# Patient Record
Sex: Male | Born: 1960 | Hispanic: No | Marital: Single | Smoking: Current every day smoker
Health system: Southern US, Community
[De-identification: ages and names within clinical notes are randomized; demographics above are authoritative.]

## PROBLEM LIST (undated history)

## (undated) DIAGNOSIS — A048 Other specified bacterial intestinal infections: Secondary | ICD-10-CM

## (undated) DIAGNOSIS — K219 Gastro-esophageal reflux disease without esophagitis: Secondary | ICD-10-CM

## (undated) HISTORY — DX: Other specified bacterial intestinal infections: A04.8

---

## 2021-11-25 ENCOUNTER — Inpatient Hospital Stay (HOSPITAL_COMMUNITY)
Admission: EM | Admit: 2021-11-25 | Discharge: 2021-12-01 | DRG: 380 | Disposition: A | Discharge status: Home / Self Care | Payer: Commercial Managed Care - PPO | Attending: Internal Medicine | Admitting: Internal Medicine

## 2021-11-25 ENCOUNTER — Encounter (HOSPITAL_COMMUNITY)
Admission: EM | Disposition: A | Discharge status: Home / Self Care | Payer: Self-pay | Source: Home / Self Care | Attending: Internal Medicine

## 2021-11-25 ENCOUNTER — Emergency Department (HOSPITAL_COMMUNITY): Payer: Commercial Managed Care - PPO

## 2021-11-25 ENCOUNTER — Other Ambulatory Visit: Payer: Self-pay

## 2021-11-25 ENCOUNTER — Inpatient Hospital Stay (HOSPITAL_COMMUNITY): Payer: Commercial Managed Care - PPO | Admitting: Certified Registered Nurse Anesthetist

## 2021-11-25 ENCOUNTER — Encounter (HOSPITAL_COMMUNITY): Payer: Self-pay | Admitting: *Deleted

## 2021-11-25 DIAGNOSIS — K2101 Gastro-esophageal reflux disease with esophagitis, with bleeding: Secondary | ICD-10-CM

## 2021-11-25 DIAGNOSIS — K254 Chronic or unspecified gastric ulcer with hemorrhage: Secondary | ICD-10-CM

## 2021-11-25 DIAGNOSIS — E860 Dehydration: Secondary | ICD-10-CM | POA: Diagnosis present

## 2021-11-25 DIAGNOSIS — K311 Adult hypertrophic pyloric stenosis: Secondary | ICD-10-CM | POA: Diagnosis present

## 2021-11-25 DIAGNOSIS — Z20822 Contact with and (suspected) exposure to covid-19: Secondary | ICD-10-CM | POA: Diagnosis present

## 2021-11-25 DIAGNOSIS — Z681 Body mass index (BMI) 19 or less, adult: Secondary | ICD-10-CM | POA: Diagnosis not present

## 2021-11-25 DIAGNOSIS — R748 Abnormal levels of other serum enzymes: Secondary | ICD-10-CM | POA: Diagnosis present

## 2021-11-25 DIAGNOSIS — B9681 Helicobacter pylori [H. pylori] as the cause of diseases classified elsewhere: Secondary | ICD-10-CM | POA: Diagnosis present

## 2021-11-25 DIAGNOSIS — R7303 Prediabetes: Secondary | ICD-10-CM | POA: Diagnosis present

## 2021-11-25 DIAGNOSIS — D72829 Elevated white blood cell count, unspecified: Secondary | ICD-10-CM

## 2021-11-25 DIAGNOSIS — E872 Acidosis, unspecified: Secondary | ICD-10-CM | POA: Diagnosis present

## 2021-11-25 DIAGNOSIS — N179 Acute kidney failure, unspecified: Secondary | ICD-10-CM | POA: Diagnosis present

## 2021-11-25 DIAGNOSIS — K2970 Gastritis, unspecified, without bleeding: Secondary | ICD-10-CM

## 2021-11-25 DIAGNOSIS — R64 Cachexia: Secondary | ICD-10-CM | POA: Diagnosis present

## 2021-11-25 DIAGNOSIS — N400 Enlarged prostate without lower urinary tract symptoms: Secondary | ICD-10-CM | POA: Diagnosis present

## 2021-11-25 DIAGNOSIS — I959 Hypotension, unspecified: Secondary | ICD-10-CM

## 2021-11-25 DIAGNOSIS — E876 Hypokalemia: Secondary | ICD-10-CM | POA: Diagnosis present

## 2021-11-25 DIAGNOSIS — K250 Acute gastric ulcer with hemorrhage: Secondary | ICD-10-CM | POA: Diagnosis not present

## 2021-11-25 DIAGNOSIS — Z79899 Other long term (current) drug therapy: Secondary | ICD-10-CM | POA: Diagnosis not present

## 2021-11-25 DIAGNOSIS — D62 Acute posthemorrhagic anemia: Secondary | ICD-10-CM

## 2021-11-25 DIAGNOSIS — R739 Hyperglycemia, unspecified: Secondary | ICD-10-CM | POA: Diagnosis present

## 2021-11-25 DIAGNOSIS — K2100 Gastro-esophageal reflux disease with esophagitis, without bleeding: Secondary | ICD-10-CM | POA: Diagnosis present

## 2021-11-25 DIAGNOSIS — K259 Gastric ulcer, unspecified as acute or chronic, without hemorrhage or perforation: Secondary | ICD-10-CM | POA: Diagnosis not present

## 2021-11-25 DIAGNOSIS — R6510 Systemic inflammatory response syndrome (SIRS) of non-infectious origin without acute organ dysfunction: Secondary | ICD-10-CM

## 2021-11-25 DIAGNOSIS — N289 Disorder of kidney and ureter, unspecified: Secondary | ICD-10-CM

## 2021-11-25 DIAGNOSIS — R109 Unspecified abdominal pain: Secondary | ICD-10-CM

## 2021-11-25 DIAGNOSIS — F17210 Nicotine dependence, cigarettes, uncomplicated: Secondary | ICD-10-CM | POA: Diagnosis present

## 2021-11-25 HISTORY — DX: Gastro-esophageal reflux disease without esophagitis: K21.9

## 2021-11-25 HISTORY — PX: BIOPSY: SHX5522

## 2021-11-25 HISTORY — PX: ESOPHAGOGASTRODUODENOSCOPY (EGD) WITH PROPOFOL: SHX5813

## 2021-11-25 LAB — CBC WITH DIFFERENTIAL/PLATELET
Abs Immature Granulocytes: 0.18 10*3/uL — ABNORMAL HIGH (ref 0.00–0.07)
Basophils Absolute: 0 10*3/uL (ref 0.0–0.1)
Basophils Relative: 0 %
Eosinophils Absolute: 0 10*3/uL (ref 0.0–0.5)
Eosinophils Relative: 0 %
HCT: 25.7 % — ABNORMAL LOW (ref 39.0–52.0)
Hemoglobin: 7.7 g/dL — ABNORMAL LOW (ref 13.0–17.0)
Immature Granulocytes: 1 %
Lymphocytes Relative: 3 %
Lymphs Abs: 0.6 10*3/uL — ABNORMAL LOW (ref 0.7–4.0)
MCH: 27.3 pg (ref 26.0–34.0)
MCHC: 30 g/dL (ref 30.0–36.0)
MCV: 91.1 fL (ref 80.0–100.0)
Monocytes Absolute: 1.5 10*3/uL — ABNORMAL HIGH (ref 0.1–1.0)
Monocytes Relative: 7 %
Neutro Abs: 19.7 10*3/uL — ABNORMAL HIGH (ref 1.7–7.7)
Neutrophils Relative %: 89 %
Platelets: 335 10*3/uL (ref 150–400)
RBC: 2.82 MIL/uL — ABNORMAL LOW (ref 4.22–5.81)
RDW: 17.5 % — ABNORMAL HIGH (ref 11.5–15.5)
WBC: 21.9 10*3/uL — ABNORMAL HIGH (ref 4.0–10.5)
nRBC: 0 % (ref 0.0–0.2)

## 2021-11-25 LAB — COMPREHENSIVE METABOLIC PANEL
ALT: 52 U/L — ABNORMAL HIGH (ref 0–44)
AST: 75 U/L — ABNORMAL HIGH (ref 15–41)
Albumin: 2.4 g/dL — ABNORMAL LOW (ref 3.5–5.0)
Alkaline Phosphatase: 63 U/L (ref 38–126)
Anion gap: 16 — ABNORMAL HIGH (ref 5–15)
BUN: 24 mg/dL — ABNORMAL HIGH (ref 8–23)
CO2: 18 mmol/L — ABNORMAL LOW (ref 22–32)
Calcium: 7.2 mg/dL — ABNORMAL LOW (ref 8.9–10.3)
Chloride: 106 mmol/L (ref 98–111)
Creatinine, Ser: 1.95 mg/dL — ABNORMAL HIGH (ref 0.61–1.24)
GFR, Estimated: 38 mL/min — ABNORMAL LOW (ref >60)
Glucose, Bld: 155 mg/dL — ABNORMAL HIGH (ref 70–99)
Potassium: 3.9 mmol/L (ref 3.5–5.1)
Sodium: 140 mmol/L (ref 135–145)
Total Bilirubin: 0.7 mg/dL (ref 0.3–1.2)
Total Protein: 5.4 g/dL — ABNORMAL LOW (ref 6.5–8.1)

## 2021-11-25 LAB — URINALYSIS, ROUTINE W REFLEX MICROSCOPIC
Bilirubin Urine: NEGATIVE
Glucose, UA: 50 mg/dL — AB
Ketones, ur: NEGATIVE mg/dL
Leukocytes,Ua: NEGATIVE
Nitrite: NEGATIVE
Protein, ur: 100 mg/dL — AB
Specific Gravity, Urine: 1.011 (ref 1.005–1.030)
pH: 7 (ref 5.0–8.0)

## 2021-11-25 LAB — I-STAT CHEM 8, ED
BUN: 27 mg/dL — ABNORMAL HIGH (ref 8–23)
Calcium, Ion: 0.85 mmol/L — CL (ref 1.15–1.40)
Chloride: 104 mmol/L (ref 98–111)
Creatinine, Ser: 1.9 mg/dL — ABNORMAL HIGH (ref 0.61–1.24)
Glucose, Bld: 149 mg/dL — ABNORMAL HIGH (ref 70–99)
HCT: 25 % — ABNORMAL LOW (ref 39.0–52.0)
Hemoglobin: 8.5 g/dL — ABNORMAL LOW (ref 13.0–17.0)
Potassium: 4.1 mmol/L (ref 3.5–5.1)
Sodium: 138 mmol/L (ref 135–145)
TCO2: 21 mmol/L — ABNORMAL LOW (ref 22–32)

## 2021-11-25 LAB — RESP PANEL BY RT-PCR (FLU A&B, COVID) ARPGX2
Influenza A by PCR: NEGATIVE
Influenza B by PCR: NEGATIVE
SARS Coronavirus 2 by RT PCR: NEGATIVE

## 2021-11-25 LAB — LACTIC ACID, PLASMA
Lactic Acid, Venous: 8.5 mmol/L (ref 0.5–1.9)
Lactic Acid, Venous: 5.3 mmol/L (ref 0.5–1.9)
Lactic Acid, Venous: 2.6 mmol/L (ref 0.5–1.9)
Lactic Acid, Venous: 2.8 mmol/L (ref 0.5–1.9)

## 2021-11-25 LAB — LIPASE, BLOOD: Lipase: 30 U/L (ref 11–51)

## 2021-11-25 LAB — CBG MONITORING, ED: Glucose-Capillary: 137 mg/dL — ABNORMAL HIGH (ref 70–99)

## 2021-11-25 LAB — POC OCCULT BLOOD, ED: Fecal Occult Bld: NEGATIVE

## 2021-11-25 LAB — ABO/RH: ABO/RH(D): O POS

## 2021-11-25 LAB — HIV ANTIBODY (ROUTINE TESTING W REFLEX): HIV Screen 4th Generation wRfx: NONREACTIVE

## 2021-11-25 LAB — PROTIME-INR
INR: 1.4 — ABNORMAL HIGH (ref 0.8–1.2)
Prothrombin Time: 16.7 seconds — ABNORMAL HIGH (ref 11.4–15.2)

## 2021-11-25 SURGERY — ESOPHAGOGASTRODUODENOSCOPY (EGD) WITH PROPOFOL
Anesthesia: General

## 2021-11-25 MED ORDER — PHENYLEPHRINE 40 MCG/ML (10ML) SYRINGE FOR IV PUSH (FOR BLOOD PRESSURE SUPPORT)
PREFILLED_SYRINGE | INTRAVENOUS | Status: DC | PRN
  Administered 2021-11-25 (×4): 80 ug via INTRAVENOUS
  Administered 2021-11-25: 160 ug via INTRAVENOUS

## 2021-11-25 MED ORDER — ACETAMINOPHEN 325 MG PO TABS: 650.0000 mg | ORAL_TABLET | Freq: Four times a day (QID) | ORAL | Status: DC | PRN

## 2021-11-25 MED ORDER — LACTATED RINGERS IV SOLN
INTRAVENOUS | Status: DC
Start: 2021-11-25 — End: 2021-11-29

## 2021-11-25 MED ORDER — CALCIUM GLUCONATE-NACL 1-0.675 GM/50ML-% IV SOLN
1.0000 g | Freq: Once | INTRAVENOUS | Status: AC
  Administered 2021-11-25: 1000 mg via INTRAVENOUS
  Filled 2021-11-25: qty 50

## 2021-11-25 MED ORDER — PROPOFOL 10 MG/ML IV BOLUS
INTRAVENOUS | Status: DC | PRN
Start: 2021-11-25 — End: 2021-11-25
  Administered 2021-11-25: 20 mg via INTRAVENOUS
  Administered 2021-11-25: 130 mg via INTRAVENOUS

## 2021-11-25 MED ORDER — ONDANSETRON HCL 4 MG/2ML IJ SOLN
INTRAMUSCULAR | Status: DC | PRN
  Administered 2021-11-25: 4 mg via INTRAVENOUS

## 2021-11-25 MED ORDER — LIDOCAINE 2% (20 MG/ML) 5 ML SYRINGE
INTRAMUSCULAR | Status: DC | PRN
  Administered 2021-11-25: 80 mg via INTRAVENOUS

## 2021-11-25 MED ORDER — OXYCODONE HCL 5 MG PO TABS: 5.0000 mg | ORAL_TABLET | ORAL | Status: DC | PRN

## 2021-11-25 MED ORDER — HYDRALAZINE HCL 20 MG/ML IJ SOLN: 5.0000 mg | INTRAMUSCULAR | Status: DC | PRN

## 2021-11-25 MED ORDER — SODIUM CHLORIDE 0.9 % IV SOLN
2.0000 g | INTRAVENOUS | Status: DC
  Administered 2021-11-25: 2 g via INTRAVENOUS
  Filled 2021-11-25: qty 2

## 2021-11-25 MED ORDER — ONDANSETRON HCL 4 MG/2ML IJ SOLN: 4.0000 mg | Freq: Four times a day (QID) | INTRAMUSCULAR | Status: DC | PRN

## 2021-11-25 MED ORDER — VANCOMYCIN HCL IN DEXTROSE 1-5 GM/200ML-% IV SOLN
1000.0000 mg | Freq: Once | INTRAVENOUS | Status: AC
  Administered 2021-11-25: 1000 mg via INTRAVENOUS
  Filled 2021-11-25: qty 200

## 2021-11-25 MED ORDER — PANTOPRAZOLE SODIUM 40 MG IV SOLR
40.0000 mg | Freq: Two times a day (BID) | INTRAVENOUS | Status: DC
  Administered 2021-11-25 – 2021-11-30 (×10): 40 mg via INTRAVENOUS
  Filled 2021-11-25 (×11): qty 10

## 2021-11-25 MED ORDER — MORPHINE SULFATE (PF) 2 MG/ML IV SOLN: 2.0000 mg | INTRAVENOUS | Status: DC | PRN

## 2021-11-25 MED ORDER — BOOST / RESOURCE BREEZE PO LIQD CUSTOM
1.0000 | Freq: Three times a day (TID) | ORAL | Status: DC
  Administered 2021-11-25 – 2021-12-01 (×16): 1 via ORAL
  Filled 2021-11-25: qty 1

## 2021-11-25 MED ORDER — DEXAMETHASONE SODIUM PHOSPHATE 10 MG/ML IJ SOLN
INTRAMUSCULAR | Status: DC | PRN
  Administered 2021-11-25: 5 mg via INTRAVENOUS

## 2021-11-25 MED ORDER — SODIUM CHLORIDE 0.9 % IV SOLN: Freq: Once | INTRAVENOUS | Status: AC

## 2021-11-25 MED ORDER — LACTATED RINGERS IV SOLN: INTRAVENOUS | Status: DC | PRN

## 2021-11-25 MED ORDER — PANTOPRAZOLE 80MG IVPB - SIMPLE MED
80.0000 mg | Freq: Once | INTRAVENOUS | Status: AC
Start: 2021-11-25 — End: 2021-11-25
  Administered 2021-11-25: 80 mg via INTRAVENOUS
  Filled 2021-11-25: qty 80

## 2021-11-25 MED ORDER — ONDANSETRON HCL 4 MG PO TABS: 4.0000 mg | ORAL_TABLET | Freq: Four times a day (QID) | ORAL | Status: DC | PRN

## 2021-11-25 MED ORDER — ACETAMINOPHEN 650 MG RE SUPP: 650.0000 mg | Freq: Four times a day (QID) | RECTAL | Status: DC | PRN

## 2021-11-25 MED ORDER — SUCRALFATE 1 GM/10ML PO SUSP
1.0000 g | Freq: Four times a day (QID) | ORAL | Status: DC
  Administered 2021-11-25 – 2021-12-01 (×22): 1 g via ORAL
  Filled 2021-11-25 (×25): qty 10

## 2021-11-25 MED ORDER — SODIUM CHLORIDE 0.9 % IV BOLUS
1000.0000 mL | Freq: Once | INTRAVENOUS | Status: AC
  Administered 2021-11-25: 1000 mL via INTRAVENOUS

## 2021-11-25 MED ORDER — METRONIDAZOLE 500 MG/100ML IV SOLN
500.0000 mg | Freq: Once | INTRAVENOUS | Status: AC
  Administered 2021-11-25: 500 mg via INTRAVENOUS
  Filled 2021-11-25: qty 100

## 2021-11-25 MED ORDER — VANCOMYCIN HCL 750 MG/150ML IV SOLN: 750.0000 mg | INTRAVENOUS | Status: DC

## 2021-11-25 MED ORDER — SUCCINYLCHOLINE CHLORIDE 200 MG/10ML IV SOSY
PREFILLED_SYRINGE | INTRAVENOUS | Status: DC | PRN
  Administered 2021-11-25: 120 mg via INTRAVENOUS

## 2021-11-25 SURGICAL SUPPLY — 15 items

## 2021-11-25 NOTE — Progress Notes (Signed)
Pharmacy Antibiotic Note  Christian Owens is a 61 y.o. male admitted on 11/25/2021 presenting with abdominal pain, concern for sepsis.  Pharmacy has been consulted for cefepime and vancomycin dosing.   Plan: Vancomycin 1000 mg IV x 1, then 750 mg IV q 36h (eAUC 513, Goal AUC 400-550, SCr 1.9) Cefepime 2g IV q 24 hours Monitor renal function, Cx and clinical progression to narrow Vancomycin levels as indicated      Temp (24hrs), Avg:97.6 F (36.4 C), Min:97.6 F (36.4 C), Max:97.6 F (36.4 C)  Recent Labs  Lab 11/25/21 1030 11/25/21 1047  WBC 21.9*  --   CREATININE 1.95* 1.90*  LATICACIDVEN 8.5*  --     CrCl cannot be calculated (Unknown ideal weight.).    No Known Allergies  Bertis Ruddy, PharmD Clinical Pharmacist ED Pharmacist Phone # [phone] 11/25/2021 11:36 AM

## 2021-11-25 NOTE — Assessment & Plan Note (Addendum)
-  Creatinine elevated to 1.95 due to prolonged hypotension -Improved to normal with IV hydration

## 2021-11-25 NOTE — ED Triage Notes (Signed)
To ED for eval of abd pain for 7days. Initial bp 70/p by EMS. Past 1800 ml NS then 96/50. Pt speaks a dialect of [place]

## 2021-11-25 NOTE — Anesthesia Procedure Notes (Signed)
Procedure Name: Intubation Date/Time: 11/25/2021 3:43 PM Performed by: Janene Harvey, CRNA Pre-anesthesia Checklist: Patient identified, Emergency Drugs available, Suction available and Patient being monitored Patient Re-evaluated:Patient Re-evaluated prior to induction Oxygen Delivery Method: Circle system utilized Preoxygenation: Pre-oxygenation with 100% oxygen Induction Type: IV induction and Rapid sequence Laryngoscope Size: Mac and 4 Grade View: Grade I Tube type: Oral Tube size: 7.5 mm Number of attempts: 1 Airway Equipment and Method: Stylet and Oral airway Placement Confirmation: ETT inserted through vocal cords under direct vision, positive ETCO2 and breath sounds checked- equal and bilateral Secured at: 22 cm Tube secured with: Tape Dental Injury: Teeth and Oropharynx as per pre-operative assessment  Comments: Intubation by SRNA. Initially attempted glide go due to poor dentition, unable to pass ett, changed to MAC 4 with successful placement ett by [facility]

## 2021-11-25 NOTE — Assessment & Plan Note (Deleted)
-  Likely related to dehydration and gastric outlet obstruction rather than sepsis -His only SIRS criteria is leukocytosis (WBC 21.9) -Continue IVF and continue to trend lactate

## 2021-11-25 NOTE — Transfer of Care (Signed)
Immediate Anesthesia Transfer of Care Note  Patient: Christian Owens  Procedure(s) Performed: ESOPHAGOGASTRODUODENOSCOPY (EGD) WITH PROPOFOL BIOPSY  Patient Location: Endoscopy Unit  Anesthesia Type:General  Level of Consciousness: drowsy and patient cooperative  Airway & Oxygen Therapy: Patient Spontanous Breathing and Patient connected to face mask oxygen  Post-op Assessment: Report given to RN and Post -op Vital signs reviewed and stable  Post vital signs: Reviewed and stable  Last Vitals:  Vitals Value Taken Time  BP 100/51   Temp    Pulse 74   Resp 11   SpO2 100%     Last Pain:  Vitals:   11/25/21 1509  TempSrc: Temporal  PainSc: 0-No pain         Complications: No notable events documented.

## 2021-11-25 NOTE — Assessment & Plan Note (Addendum)
-  Was low at 7.2 on admission.  Improved with IV calcium gluconate.  Albumin level low.  Corrected calcium level normal.

## 2021-11-25 NOTE — Anesthesia Postprocedure Evaluation (Signed)
Anesthesia Post Note  Patient: Christian Owens  Procedure(s) Performed: ESOPHAGOGASTRODUODENOSCOPY (EGD) WITH PROPOFOL BIOPSY     Patient location during evaluation: PACU Anesthesia Type: General Level of consciousness: awake Pain management: pain level controlled Vital Signs Assessment: post-procedure vital signs reviewed and stable Respiratory status: spontaneous breathing Cardiovascular status: stable Postop Assessment: no apparent nausea or vomiting Anesthetic complications: no   No notable events documented.  Last Vitals:  Vitals:   11/25/21 1646 11/25/21 1655  BP: (!) 113/59 122/67  Pulse: 71 65  Resp: 19 16  Temp:    SpO2: 97% 99%    Last Pain:  Vitals:   11/25/21 1655  TempSrc:   PainSc: 0-No pain                 [name]

## 2021-11-25 NOTE — Anesthesia Preprocedure Evaluation (Signed)
Anesthesia Evaluation  Patient identified by MRN, date of birth, ID band Patient awake    Reviewed: Allergy & Precautions, NPO status , Patient's Chart, lab work & pertinent test results  Airway Mallampati: II  TM Distance: >3 FB     Dental   Pulmonary Current Smoker and Patient abstained from smoking.,    breath sounds clear to auscultation       Cardiovascular negative cardio ROS   Rhythm:Regular Rate:Normal     Neuro/Psych    GI/Hepatic Neg liver ROS, GERD  ,History noted Dr. Chilton Si   Endo/Other    Renal/GU negative Renal ROS     Musculoskeletal   Abdominal   Peds  Hematology   Anesthesia Other Findings   Reproductive/Obstetrics                             Anesthesia Physical Anesthesia Plan  ASA: 3  Anesthesia Plan: General   Post-op Pain Management:    Induction: Intravenous  PONV Risk Score and Plan: Ondansetron, Dexamethasone, Midazolam and Treatment may vary due to age or medical condition  Airway Management Planned: Oral ETT  Additional Equipment:   Intra-op Plan:   Post-operative Plan: Extubation in OR  Informed Consent: I have reviewed the patients History and Physical, chart, labs and discussed the procedure including the risks, benefits and alternatives for the proposed anesthesia with the patient or authorized representative who has indicated his/her understanding and acceptance.     Dental advisory given  Plan Discussed with: CRNA and Anesthesiologist  Anesthesia Plan Comments:         Anesthesia Quick Evaluation

## 2021-11-25 NOTE — Assessment & Plan Note (Addendum)
-  Presented with history of GERD, 2 weeks of abdominal pain, nausea, vomiting, poor oral tolerance, weight loss.  CT findings as above showing possible GOO.  GI consult was obtained -Underwent EGD that showed findings as below  LA Grade A reflux esophagitis with no bleeding.  Large, friable, cratered gastric ulcer involving the incisura/antrum/prepylorus with contact oozing and copious clot with evidence of recent bleeding. Biopsied. 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 -Currently on Protonix 40 mg IV twice daily, Carafate suspension -General surgery consult appreciated.  Waiting for pathology report.  Expected later today. -Continue to monitor CBC.  Currently on full liquid diet.  Advance to soft diet today

## 2021-11-25 NOTE — H&P (Signed)
History and Physical    Patient: Christian Owens MBP:[id number] DOB: 1961-04-24 DOA: 11/25/2021 DOS: the patient was seen and examined on 11/25/2021 PCP: Pcp, No  Patient coming from: Home - lives with wife; NOK: Dereck Leep [place], [phone]   Chief Complaint: Abdominal pain  HPI: Christian Owens is a 61 y.o. male without known medical history presenting with n/v/abdominal pain.  His step-son is present and provides most of the history due to language issues.  They report some period of abdominal discomfort and reflux, for which he takes medication.  However, in the last few weeks the pain has been much worse in the mid-umbilical region and he has been unable to tolerate PO.  He has n/v with most food/drink.  He has had maybe 30 pounds of unintentional weight loss.      ER Course:   No PCP.  2-3 weeks of n/v, anorexia.  Hypotensive with EMS, given 2L NS.  Abdominal pain resolved with IVF.  Lactate 8.5, 5.3.  Heme negative.  Hgb 7.7.  WBC 21.9.  Given Vanc/Flagyl/Cefepime.  Creatinine 1.9.  CT suggestive of gastric outlet obstruction.  GI consulted.   Review of Systems: As mentioned in the history of present illness. All other systems reviewed and are negative.  Limited by language barrier.   Past Medical History:  Diagnosis Date   GERD (gastroesophageal reflux disease)    History reviewed. No pertinent surgical history. Social History:  reports that he has been smoking cigarettes. He has been smoking an average of .5 packs per day. He has never used smokeless tobacco. He reports current alcohol use. He reports that he does not use drugs.  No Known Allergies  History reviewed. No pertinent family history.  Prior to Admission medications   Not on File    Physical Exam: Vitals:   11/25/21 1130 11/25/21 1145 11/25/21 1200 11/25/21 1215  BP: 109/75 107/66 96/63 93/63   Pulse: 71 72 70 67  Resp: 18 17 19 16   Temp:      TempSrc:      SpO2: 100% 100% 100% 100%  Weight:        General:  Appears calm and comfortable and is in NAD, cachectic Eyes:  PERRL, EOMI, normal lids, iris ENT:  grossly normal hearing, lips & tongue, mmm; appropriate dentition Neck:  no LAD, masses or thyromegaly Cardiovascular:  RRR, no m/r/g. No LE edema.  Respiratory:   CTA bilaterally with no wheezes/rales/rhonchi.  Normal respiratory effort. Abdomen:  soft, NT, ND, hypoactive BS Skin:  no rash or induration seen on limited exam Musculoskeletal:  grossly normal tone BUE/BLE, good ROM, no bony abnormality Psychiatric:  grossly normal mood and affect, speech fluent and appropriate, AOx3 Neurologic:  CN 2-12 grossly intact, moves all extremities in coordinated fashion   Radiological Exams on Admission: Independently reviewed - see discussion in A/P where applicable  CT ABDOMEN PELVIS WO CONTRAST  Result Date: 11/25/2021 CLINICAL DATA:  Generalized acute abdominal pain. EXAM: CT ABDOMEN AND PELVIS WITHOUT CONTRAST TECHNIQUE: Multidetector CT imaging of the abdomen and pelvis was performed following the standard protocol without IV contrast. RADIATION DOSE REDUCTION: This exam was performed according to the departmental dose-optimization program which includes automated exposure control, adjustment of the mA and/or kV according to patient size and/or use of iterative reconstruction technique. COMPARISON:  None. FINDINGS: Lower chest: No acute findings. 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 Hepatobiliary: No mass visualized on this unenhanced exam. Gallbladder is unremarkable. No evidence of biliary  ductal dilatation. Pancreas: No mass or inflammatory process visualized on this unenhanced exam. Spleen:  Within normal limits in size. Adrenals/Urinary tract: No evidence of urolithiasis or hydronephrosis. Distended urinary bladder noted, which could be due to urinary retention. Stomach/Bowel: Stomach is dilated, however duodenum and remainder of bowel is normal in caliber.  This could be due to gastric outlet obstruction or gastroparesis. No evidence of obstruction, inflammatory process, or abnormal fluid collections. Vascular/Lymphatic: No pathologically enlarged lymph nodes identified. No evidence of abdominal aortic aneurysm. Reproductive:  Mildly enlarged prostate. Other:  None. Musculoskeletal:  No suspicious bone lesions identified. IMPRESSION: Dilated stomach, which may be due to gastric outlet obstruction or gastroparesis. No dilated bowel loops. Mildly enlarged prostate. Distended urinary bladder; urinary retention cannot be excluded. Clinical correlation is recommended. Electronically Signed   By: Danae Orleans M.D.   On: 11/25/2021 12:18   DG Chest Port 1 View  Result Date: 11/25/2021 CLINICAL DATA:  61 year old male with history of weakness and abdominal pain for the past 7 days. EXAM: PORTABLE CHEST 1 VIEW COMPARISON:  No priors. FINDINGS: Lung volumes are normal. Calcified pleural plaques are noted in the right hemithorax. No definite left-sided calcified pleural plaques are noted. No consolidative airspace disease. No pleural effusions. No pneumothorax. No pulmonary nodule or mass noted. Pulmonary vasculature and the cardiomediastinal silhouette are within normal limits. IMPRESSION: 1. No radiographic evidence of acute cardiopulmonary disease. 2. Calcified right-sided pleural plaques, likely related to remote right-sided empyema or prior right-sided thoracic trauma. Electronically Signed   By: Trudie Reed M.D.   On: 11/25/2021 10:19    EKG: Independently reviewed.  NSR with rate 76; no evidence of acute ischemia   Labs on Admission: I have personally reviewed the available labs and imaging studies at the time of the admission.  Pertinent labs:    CO2 18 Glucose 155 BUN 24/Creatinine 1.95/GFR 38 Calcium 7.2, ionized Ca++ 0.85 Anion gap 16 Albumin 2.4 AST 75/ALT 52 Lactate 8.5 -> 5.3 WBC 21.9 Hgb 7.7 INR 1.4 COVID/flu negative Heme  negative    Assessment and Plan: * Gastric outlet obstruction- (present on admission) -Patient without known medical problems presenting with abdominal pain and n/v -He has had GERD for a time -Recent unintentional weight loss -Imaging is concerning for gastric outlet obstruction -GI consulted -Will admit to Med Surg for further evaluation -NPO for now  Lactic acidosis- (present on admission) -Likely related to dehydration and gastric outlet obstruction rather than sepsis -His only SIRS criteria is leukocytosis (WBC 21.9) -Continue IVF and continue to trend lactate  Hypocalcemia- (present on admission) -Will give 1 gram calcium gluconate at this time -Repeat ionized calcium in the AM  Renal dysfunction -Uncertain baseline creatinine, suspect AKI -Will hydrate and follow -Avoid nephrotoxic medications       Advance Care Planning:   Code Status: Full Code   Consults: GI  DVT Prophylaxis: SCDs  Family Communication: Doneen Poisson was present throughout evaluation  Severity of Illness: The appropriate patient status for this patient is INPATIENT. Inpatient status is judged to be reasonable and necessary in order to provide the required intensity of service to ensure the patient's safety. The patient's presenting symptoms, physical exam findings, and initial radiographic and laboratory data in the context of their chronic comorbidities is felt to place them at high risk for further clinical deterioration. Furthermore, it is not anticipated that the patient will be medically stable for discharge from the hospital within 2 midnights of admission.   * I certify that at  the point of admission it is my clinical judgment that the patient will require inpatient hospital care spanning beyond 2 midnights from the point of admission due to high intensity of service, high risk for further deterioration and high frequency of surveillance required.*  Author: Jonah Blue, MD 11/25/2021 2:09  PM  For on call review [email or web address].

## 2021-11-25 NOTE — ED Notes (Signed)
Got patient into a gown on the monitor did ekg shown to er provider patient is resting with call bell in reach  

## 2021-11-25 NOTE — Plan of Care (Signed)
  Problem: Education: Goal: Knowledge of General Education information will improve Description Including pain rating scale, medication(s)/side effects and non-pharmacologic comfort measures Outcome: Progressing   

## 2021-11-25 NOTE — Consult Note (Addendum)
[facility] Consult: 1:55 PM 11/25/2021  LOS: 0 days    Referring Provider: Dr Lorin Mercy  Primary Care Physician:  Pcp, No Primary Gastroenterologist: unassigned    Reason for Consultation:  abdominal pain, n/v and wt loss, ?GOO   HPI: Christian Owens is a 61 y.o. male.  [place] from [place].  Limited but passable [place].  No documented PMH.  Presented to ED triage this morning w close to 2 weeks abdominal pain.  Nausea, reports of possible blood in emesis.  Weight loss.  Symptoms worsened which is why he contacted EMS.  Vomiting almost all liquids/solids.  Hypotensive.  Got 2 L of NS during transport.  Current BP is 100/65 and HR is 70. Abdominal pain has been diffuse. No black or bloody stools.  Chest x-ray: nonacute.  Does show right-sided calcified plaques probably due to previous right-sided empyema or thoracic trauma. Noncon CTAP: Stomach dilated possibly due to gastric outlet obstruction or gastroparesis.  Bowel loops not dilated.  Prostamegaly.  Urinary bladder distended, ? Urinary retention.    Glucose 155.  BUN/creatinine 27/1.9, GFR 38. T. bili 5.4.  Alk phos normal 63.  AST/ALT 75/42. Hgb 7.7, MCV 91.  Platelets 335.  WBCs 21.9 INR 1.4.  FOBT negative.  Lactic acid 8.5 >> 5.3.     No comp labs.    Vancomycin, flagyl initiated.  Received single dose of Protonix 80 mg IV, single dose Cefepime.  NPO.  LR running at 100 mL/hour.    Patient denies significant use of alcohol, reports occasional non heavy use.  Denies use of pain or any GI meds.  Lives in [place] with stepchildren.  Works in a IT sales professional.    Past Medical History:  Diagnosis Date   GERD (gastroesophageal reflux disease)     History reviewed. No pertinent surgical history.  Prior to Admission medications   Not on  File    Scheduled Meds:  Infusions:  ceFEPime (MAXIPIME) IV Stopped (11/25/21 1301)   [START ON 11/27/2021] vancomycin     PRN Meds:    Allergies as of 11/25/2021   (No Known Allergies)    History reviewed. No pertinent family history.  Social History   Socioeconomic History   Marital status: Single    Spouse name: Not on file   Number of children: Not on file   Years of education: Not on file   Highest education level: Not on file  Occupational History   Not on file  Tobacco Use   Smoking status: Every Christian    Packs/Christian: 0.50    Types: Cigarettes   Smokeless tobacco: Never  Substance and Sexual Activity   Alcohol use: Yes    Comment: occasional   Drug use: Never   Sexual activity: Not on file  Other Topics Concern   Not on file  Social History Narrative   Not on file   Social Determinants of Health   Financial Resource Strain: Not on file  Food Insecurity: Not on file  Transportation Needs: Not on file  Physical Activity: Not on file  Stress:  Not on file  Social Connections: Not on file  Intimate Partner Violence: Not on file    REVIEW OF SYSTEMS: Constitutional: Some fatigue and weakness. ENT:  No nose bleeds Pulm: No shortness of breath or cough. CV:  No palpitations, no LE edema.  No angina. GU:  No hematuria, no frequency GI: Per HPI. Heme: No unusual or excessive bleeding or bruising. Transfusions: None. Neuro:  No headaches, no peripheral tingling or numbness.  No seizures, no syncope. Derm:  No itching, no rash or sores.  Endocrine:  No sweats or chills.  No polyuria or dysuria Immunization: Not queried. Travel:  None beyond local counties in last few months.    PHYSICAL EXAM: Vital signs in last 24 hours: Vitals:   11/25/21 1200 11/25/21 1215  BP: 96/63 93/63  Pulse: 70 67  Resp: 19 16  Temp:    SpO2: 100% 100%   Wt Readings from Last 3 Encounters:  11/25/21 48.8 kg    General: Thin, comfortable, not ill-appearing.  Appears  stated age.  Able to have a conversation in  [place] using simple language and clear, slow speech. Head: No signs of head trauma or facial asymmetry. Eyes: Arcus senilis.  No conjunctival pallor. Ears: Not hard of hearing Nose: No discharge or congestion Mouth: Many missing teeth.  Remaining teeth in moderate health.  Mucosa moist, pink, clear.  Tongue midline. Neck: No mass, no thyromegaly, no JVD Lungs: Clear bilaterally.  No labored breathing or cough. Heart: Current rate 70.  Regular.  No MRG. Abdomen: Soft without tenderness.  No distention.  No organomegaly, masses, hernias, or bruits..   Rectal: Deferred Musc/Skeltl: No joint redness, swelling or gross deformity.  Overall limbs are thin. Extremities: No CCE. Neurologic: No tremors, no gross deficits.  Moves all 4 limbs formal strength testing not pursued. Skin: No rash, no sores, no suspicious lesions. Nodes: No cervical adenopathy Psych: Calm, pleasant, cooperative.  Intake/Output from [name]: No intake/output data recorded. Intake/Output this shift: Total I/O In: 1190.9 [IV Piggyback:1190.9] Out: 450 [Urine:450]  LAB RESULTS: Recent Labs    11/25/21 1030 11/25/21 1047  WBC 21.9*  --   HGB 7.7* 8.5*  HCT 25.7* 25.0*  PLT 335  --    BMET Lab Results  Component Value Date   NA 138 11/25/2021   NA 140 11/25/2021   K 4.1 11/25/2021   K 3.9 11/25/2021   CL 104 11/25/2021   CL 106 11/25/2021   CO2 18 (L) 11/25/2021   GLUCOSE 149 (H) 11/25/2021   GLUCOSE 155 (H) 11/25/2021   BUN 27 (H) 11/25/2021   BUN 24 (H) 11/25/2021   CREATININE 1.90 (H) 11/25/2021   CREATININE 1.95 (H) 11/25/2021   CALCIUM 7.2 (L) 11/25/2021   LFT Recent Labs    11/25/21 1030  PROT 5.4*  ALBUMIN 2.4*  AST 75*  ALT 52*  ALKPHOS 63  BILITOT 0.7   PT/INR Lab Results  Component Value Date   INR 1.4 (H) 11/25/2021   Hepatitis Panel No results for input(s): HEPBSAG, HCVAB, HEPAIGM, HEPBIGM in the last 72 hours. C-Diff No  components found for: CDIFF Lipase     Component Value Date/Time   LIPASE 30 11/25/2021 1030    Drugs of Abuse  No results found for: LABOPIA, COCAINSCRNUR, LABBENZ, AMPHETMU, THCU, LABBARB   RADIOLOGY STUDIES: CT ABDOMEN PELVIS WO CONTRAST  Result Date: 11/25/2021 CLINICAL DATA:  Generalized acute abdominal pain. EXAM: CT ABDOMEN AND PELVIS WITHOUT CONTRAST TECHNIQUE: Multidetector CT imaging of the  abdomen and pelvis was performed following the standard protocol without IV contrast. RADIATION DOSE REDUCTION: This exam was performed according to the departmental dose-optimization program which includes automated exposure control, adjustment of the mA and/or kV according to patient size and/or use of iterative reconstruction technique. COMPARISON:  None. FINDINGS: Lower chest: No acute findings. 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 Hepatobiliary: No mass visualized on this unenhanced exam. Gallbladder is unremarkable. No evidence of biliary ductal dilatation. Pancreas: No mass or inflammatory process visualized on this unenhanced exam. Spleen:  Within normal limits in size. Adrenals/Urinary tract: No evidence of urolithiasis or hydronephrosis. Distended urinary bladder noted, which could be due to urinary retention. Stomach/Bowel: Stomach is dilated, however duodenum and remainder of bowel is normal in caliber. This could be due to gastric outlet obstruction or gastroparesis. No evidence of obstruction, inflammatory process, or abnormal fluid collections. Vascular/Lymphatic: No pathologically enlarged lymph nodes identified. No evidence of abdominal aortic aneurysm. Reproductive:  Mildly enlarged prostate. Other:  None. Musculoskeletal:  No suspicious bone lesions identified. IMPRESSION: Dilated stomach, which may be due to gastric outlet obstruction or gastroparesis. No dilated bowel loops. Mildly enlarged prostate. Distended urinary bladder; urinary retention cannot be  excluded. Clinical correlation is recommended. Electronically Signed   By: Marlaine Hind M.D.   On: 11/25/2021 12:18   DG Chest Port 1 View  Result Date: 11/25/2021 CLINICAL DATA:  61 year old male with history of weakness and abdominal pain for the past 7 days. EXAM: PORTABLE CHEST 1 VIEW COMPARISON:  No priors. FINDINGS: Lung volumes are normal. Calcified pleural plaques are noted in the right hemithorax. No definite left-sided calcified pleural plaques are noted. No consolidative airspace disease. No pleural effusions. No pneumothorax. No pulmonary nodule or mass noted. Pulmonary vasculature and the cardiomediastinal silhouette are within normal limits. IMPRESSION: 1. No radiographic evidence of acute cardiopulmonary disease. 2. Calcified right-sided pleural plaques, likely related to remote right-sided empyema or prior right-sided thoracic trauma. Electronically Signed   By: Vinnie Langton M.D.   On: 11/25/2021 10:19      IMPRESSION:     Up to 2 weeks abdominal pain, nausea with at most scant bloody emesis.  FOBT negative.  Weight loss.  Mildly elevated transaminases and INR, otherwise normal LFTs.  No signif ETOH use.  Possible gastric outlet obstruction, normal liver/GB/Biliary tree per noncontrast CT.  R/O ulcers, r/O cancer.    Normocytic anemia.  Sepsis, hypotension, elevated lactic acid.  Urine and blood cultures collected, so far no active infection.    AKI.  Baseline renal fx unknown.  CT raising ? Of urinary retention.  Recorded Urine output 450 mL so far    Hyperglycemia, sugars 140s to 150s.      PLAN:     Will need EGD for further evaluation.  ?timing.  Discussed w pt and he is agreeable.  For now keep patient n.p.o. to allow for possible EGD today.  Also given that he has been vomiting pretty much everything he eats or drinks there is concern he could develop aspiration pneumonia.      Azucena Freed  11/25/2021, 1:55 PM Phone [phone]  I have taken a history,  reviewed the chart and examined the patient. I performed a substantive portion of this encounter, including complete performance of at least one of the key components, in conjunction with the APP. I agree with the APP's note, impression and recommendations  Patient symptoms and CT findings suggestive of GOO.  Reported 30lb unintentional  weight loss concerning for malignancy.  EGD to assess for malignancy/PUD.  Plan for biopsies/diagnosis only at this point.  Further recommendations pending EGD findings.  Recommending intubation for EGD due to high risk of aspiration  Rameen Gohlke E. Candis Schatz, MD [facility]

## 2021-11-25 NOTE — Op Note (Signed)
[facility] Patient Name: Christian Owens Procedure Date : 11/25/2021 MRN: [id number] Attending MD: Gladstone Pih. Candis Schatz , MD Date of Birth: 29-Oct-1960 CSN: [id number] Age: 61 Admit Type: Inpatient Procedure:                Upper GI endoscopy Indications:              Epigastric abdominal pain, Abnormal CT of the GI                            tract, Nausea with vomiting, Weight loss Providers:                Nicki Reaper E. Candis Schatz, MD, Dulcy Fanny, [facility] Technician, Technician Referring MD:              Medicines:                General Anesthesia Complications:            No immediate complications. Estimated Blood Loss:     Estimated blood loss was minimal. Procedure:                Pre-Anesthesia Assessment:                           - Prior to the procedure, a History and Physical                            was performed, and patient medications and                            allergies were reviewed. The patient's tolerance of                            previous anesthesia was also reviewed. The risks                            and benefits of the procedure and the sedation                            options and risks were discussed with the patient.                            All questions were answered, and informed consent                            was obtained. Prior Anticoagulants: The patient has                            taken no previous anticoagulant or antiplatelet                            agents. ASA Grade Assessment: II - A patient with  mild systemic disease. After reviewing the risks                            and benefits, the patient was deemed in                            satisfactory condition to undergo the procedure.                           After obtaining informed consent, the endoscope was                            passed under direct vision. Throughout the                             procedure, the patient's blood pressure, pulse, and                            oxygen saturations were monitored continuously. The                            [id number] Olympus endoscope was introduced                            through the mouth, and advanced to the second part                            of duodenum. The upper GI endoscopy was                            accomplished without difficulty. The patient                            tolerated the procedure well. Scope In: Scope Out: Findings:      LA Grade A (one or more mucosal breaks less than 5 mm, not extending       between tops of 2 mucosal folds) esophagitis with no bleeding was found       at the gastroesophageal junction.      One large friable, cratered gastric ulcer was found at the incisura, on       the lesser curvature of the gastric antrum and in the prepyloric region       of the stomach, causing deformity and stenosis of the . The lesion was       30 mm in largest dimension. Biopsies were taken with a cold forceps for       histology and Helicobacter pylori testing. Estimated blood loss was       minimal.      A moderate stenosis was found at the pylorus. This was traversed.      Clotted blood was found in the gastric fundus.      The exam of the stomach was otherwise normal.      Biopsies were taken with a cold forceps in the gastric antrum for       Helicobacter pylori testing. Estimated blood loss was minimal.      The  examined duodenum was normal. Impression:               - LA Grade A reflux esophagitis with no bleeding.                           - Large, friable, cratered gastric ulcer involving                            the incisura/antrum/prepylorus with contact oozing                            and copious clot. No active bleeding, but evidence                            of recent bleeding. Biopsied. 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                           - Copious clotted blood in the gastric fundus.                           - Normal examined duodenum.                           - Biopsies were taken with a cold forceps for                            Helicobacter pylori testing. Recommendation:           - Return patient to hospital ward for ongoing care.                           - Clear liquid diet okay.                           - Use Protonix (pantoprazole) 40 mg IV BID.                           - Use sucralfate suspension 1 gram PO QID.                           - Await pathology results.                           - Surgery consult                           - Trend CBC q12hours Procedure Code(s):        ---  Professional ---                           [phone], Esophagogastroduodenoscopy, flexible,                            transoral; with biopsy, single or multiple Diagnosis Code(s):        --- Professional ---                           K21.00, Gastro-esophageal reflux disease with                            esophagitis, without bleeding                           K25.9, Gastric ulcer, unspecified as acute or                            chronic, without hemorrhage or perforation                           K31.1, Adult hypertrophic pyloric stenosis                           K92.2, Gastrointestinal hemorrhage, unspecified                           R10.13, Epigastric pain                           R11.2, Nausea with vomiting, unspecified                           R63.4, Abnormal weight loss                           R93.3, Abnormal findings on diagnostic imaging of                            other parts of digestive tract CPT copyright 2019 [employer]. All rights reserved. The codes documented in this report are preliminary and upon coder review may  be revised to meet current compliance  requirements. Demond Shallenberger E. Candis Schatz, MD 11/25/2021 4:36:10 PM This report has been signed electronically. Number of Addenda: 0

## 2021-11-25 NOTE — ED Notes (Signed)
Checked patient cbg it wAS 137 NOTIFIED rn COF BLOOD SUGAR

## 2021-11-25 NOTE — Consult Note (Signed)
Christian Owens 02/11/1961  [id number].    Requesting MD: Tomasa Randunningham, MD Chief Complaint/Reason for Consult: Gastric Outlet Obstruction (GOO)  Speaks Montagnard Koho.  No family currently at bedside to translate so history is obtained from the chart only at this time.  He is in endoscopy and waking up from his procedure.  HPI:  Christian Owens is a 61 y/o male with a history of GERD who presented 11/25/21 with a cc abdominal pain, nausea, and vomiting. He reports a 3 week history of non-radiating, upper abdominal discomfort assocaited with nausea and vomiting. He has not been able to tolerate solid foods for a little while but can usually keep down liquids. He has lost 30 lbs during this time period.  He presented to the ED where he underwent a CT scan with a dilated stomach concerning for GOO vs gastroparesis.  GI was consulted and underwent an upper endoscopy today that reveals an ulceration at his pylorus with narrowing.  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  It was biopsied.  It is currently not bleeding.  He was able to traverse this with the scope, but it was very tight.  We have been asked to see him for further evaluation.  Social history: smokes 0.5 ppd cigarettes, denies other drug use, drinks alcohol occasionally Blood thinners: none Past abdominal surgeries: none    ROS: ROS: unable as there is no Nurse, learning disabilitytranslator.  History reviewed. No pertinent family history.  Past Medical History:  Diagnosis Date   GERD (gastroesophageal reflux disease)     History reviewed. No pertinent surgical history.  Social History:  reports that he has been smoking cigarettes. He has been smoking an average of .5 packs per day. He has never used smokeless tobacco. He reports current alcohol use. He reports that he does not use drugs.  Allergies: No Known Allergies  No medications prior to admission.     Physical Exam: Blood pressure (!) (P) 100/51, pulse (P) 74, temperature (P) 98.4  F (36.9 C), temperature source (P) Temporal, resp. rate (P) 20, height 5\' 2"  (1.575 m), weight 48.8 kg, SpO2 (P) 100 %. General: pleasant, skinny male who is laying in bed in NAD HEENT: head is normocephalic, atraumatic.  Sclera are noninjected.  PERRL.  Ears and nose without any masses or lesions.  Mouth is pink and moist Heart: regular, rate, and rhythm.  Normal s1,s2. No obvious murmurs, gallops, or rubs noted.  Palpable radial and pedal pulses bilaterally Lungs: CTAB, no wheezes, rhonchi, or rales noted.  Respiratory effort nonlabored Abd: soft, NT, slight distention in upper abdomen with some tympany, but otherwise nondistended elsewhere, +BS, no masses, hernias, or organomegaly MS: all 4 extremities are symmetrical with no cyanosis, clubbing, or edema.  Cachetic appearing with muscle atrophy noted Skin: warm and dry with no masses, lesions, or rashes Neuro: Cranial nerves 2-12 grossly intact, sensation is normal throughout Psych: unable due to language barrier   Results for orders placed or performed during the hospital encounter of 11/25/21 (from the past 48 hour(s))  Resp Panel by RT-PCR (Flu A&B, Covid) Nasopharyngeal Swab     Status: None   Collection Time: 11/25/21 10:30 AM   Specimen: Nasopharyngeal Swab; Nasopharyngeal(NP) swabs in vial transport medium  Result Value Ref Range   SARS Coronavirus 2 by RT PCR NEGATIVE NEGATIVE    Comment: (NOTE) SARS-CoV-2 target nucleic acids are NOT DETECTED.  The SARS-CoV-2 RNA is generally detectable in upper respiratory specimens during the acute phase of  infection. The lowest concentration of SARS-CoV-2 viral copies this assay can detect is 138 copies/mL. A negative result does not preclude SARS-Cov-2 infection and should not be used as the sole basis for treatment or other patient management decisions. A negative result may occur with  improper specimen collection/handling, submission of specimen other than nasopharyngeal swab,  presence of viral mutation(s) within the areas targeted by this assay, and inadequate number of viral copies(<138 copies/mL). A negative result must be combined with clinical observations, patient history, and epidemiological information. The expected result is Negative.  Fact Sheet for Patients:  BloggerCourse.comhttps://www.fda.gov/media/152166/download  Fact Sheet for Healthcare Providers:  SeriousBroker.ithttps://www.fda.gov/media/152162/download  This test is no t yet approved or cleared by the [place] FDA and  has been authorized for detection and/or diagnosis of SARS-CoV-2 by FDA under an Emergency Use Authorization (EUA). This EUA will remain  in effect (meaning this test can be used) for the duration of the COVID-19 declaration under Section 564(b)(1) of the Act, 21 U.S.C.section 360bbb-3(b)(1), unless the authorization is terminated  or revoked sooner.       Influenza A by PCR NEGATIVE NEGATIVE   Influenza B by PCR NEGATIVE NEGATIVE    Comment: (NOTE) The Xpert Xpress SARS-CoV-2/FLU/RSV plus assay is intended as an aid in the diagnosis of influenza from Nasopharyngeal swab specimens and should not be used as a sole basis for treatment. Nasal washings and aspirates are unacceptable for Xpert Xpress SARS-CoV-2/FLU/RSV testing.  Fact Sheet for Patients: BloggerCourse.comhttps://www.fda.gov/media/152166/download  Fact Sheet for Healthcare Providers: SeriousBroker.ithttps://www.fda.gov/media/152162/download  This test is not yet approved or cleared by the [place] FDA and has been authorized for detection and/or diagnosis of SARS-CoV-2 by FDA under an Emergency Use Authorization (EUA). This EUA will remain in effect (meaning this test can be used) for the duration of the COVID-19 declaration under Section 564(b)(1) of the Act, 21 U.S.C. section 360bbb-3(b)(1), unless the authorization is terminated or revoked.  Performed at [facility], [address] [place], [place] [address]   Lactic acid, plasma      Status: Abnormal   Collection Time: 11/25/21 10:30 AM  Result Value Ref Range   Lactic Acid, Venous 8.5 (HH) 0.5 - 1.9 mmol/L    Comment: CRITICAL RESULT CALLED TO, READ BACK BY AND VERIFIED WITH: S.BERTRAND RN 1122 11/25/21 MCCORMICK K Performed at [facility], [address] [place], [place] [address]   Comprehensive metabolic panel     Status: Abnormal   Collection Time: 11/25/21 10:30 AM  Result Value Ref Range   Sodium 140 135 - 145 mmol/L   Potassium 3.9 3.5 - 5.1 mmol/L   Chloride 106 98 - 111 mmol/L   CO2 18 (L) 22 - 32 mmol/L   Glucose, Bld 155 (H) 70 - 99 mg/dL    Comment: Glucose reference range applies only to samples taken after fasting for at least 8 hours.   BUN 24 (H) 8 - 23 mg/dL   Creatinine, Ser 4.781.95 (H) 0.61 - 1.24 mg/dL   Calcium 7.2 (L) 8.9 - 10.3 mg/dL   Total Protein 5.4 (L) 6.5 - 8.1 g/dL   Albumin 2.4 (L) 3.5 - 5.0 g/dL   AST 75 (H) 15 - 41 U/L   ALT 52 (H) 0 - 44 U/L   Alkaline Phosphatase 63 38 - 126 U/L   Total Bilirubin 0.7 0.3 - 1.2 mg/dL   GFR, Estimated 38 (L) >60 mL/min    Comment: (NOTE) Calculated using the CKD-EPI Creatinine Equation (2021)  Anion gap 16 (H) 5 - 15    Comment: Performed at [facility], [address], [place], [place] [address]  Lipase, blood     Status: None   Collection Time: 11/25/21 10:30 AM  Result Value Ref Range   Lipase 30 11 - 51 U/L    Comment: Performed at [facility], [address] [place], [place] [address]  CBC with Differential     Status: Abnormal   Collection Time: 11/25/21 10:30 AM  Result Value Ref Range   WBC 21.9 (H) 4.0 - 10.5 K/uL   RBC 2.82 (L) 4.22 - 5.81 MIL/uL   Hemoglobin 7.7 (L) 13.0 - 17.0 g/dL   HCT 33.2 (L) 95.1 - 88.4 %   MCV 91.1 80.0 - 100.0 fL   MCH 27.3 26.0 - 34.0 pg   MCHC 30.0 30.0 - 36.0 g/dL   RDW 16.6 (H) 06.3 - 01.6 %   Platelets 335 150 - 400 K/uL    Comment: REPEATED TO VERIFY   nRBC 0.0 0.0 - 0.2 %   Neutrophils Relative % 89 %   Neutro Abs  19.7 (H) 1.7 - 7.7 K/uL   Lymphocytes Relative 3 %   Lymphs Abs 0.6 (L) 0.7 - 4.0 K/uL   Monocytes Relative 7 %   Monocytes Absolute 1.5 (H) 0.1 - 1.0 K/uL   Eosinophils Relative 0 %   Eosinophils Absolute 0.0 0.0 - 0.5 K/uL   Basophils Relative 0 %   Basophils Absolute 0.0 0.0 - 0.1 K/uL   Immature Granulocytes 1 %   Abs Immature Granulocytes 0.18 (H) 0.00 - 0.07 K/uL    Comment: Performed at [facility], [address] [place], [place] [address]  Protime-INR     Status: Abnormal   Collection Time: 11/25/21 10:30 AM  Result Value Ref Range   Prothrombin Time 16.7 (H) 11.4 - 15.2 seconds   INR 1.4 (H) 0.8 - 1.2    Comment: (NOTE) INR goal varies based on device and disease states. Performed at [facility], [address], [place], [place] [address]   Type and screen [facility]     Status: None   Collection Time: 11/25/21 10:30 AM  Result Value Ref Range   ABO/RH(D) O POS    Antibody Screen NEG    Sample Expiration      11/28/2021,2359 Performed at [facility], [address], [place], [place] [address]   CBG monitoring, ED     Status: Abnormal   Collection Time: 11/25/21 10:36 AM  Result Value Ref Range   Glucose-Capillary 137 (H) 70 - 99 mg/dL    Comment: Glucose reference range applies only to samples taken after fasting for at least 8 hours.  ABO/Rh     Status: None   Collection Time: 11/25/21 10:43 AM  Result Value Ref Range   ABO/RH(D)      O POS Performed at [facility], [address] [place], [place] [address]   I-stat chem 8, ED     Status: Abnormal   Collection Time: 11/25/21 10:47 AM  Result Value Ref Range   Sodium 138 135 - 145 mmol/L   Potassium 4.1 3.5 - 5.1 mmol/L   Chloride 104 98 - 111 mmol/L   BUN 27 (H) 8 - 23 mg/dL   Creatinine, Ser 6.23 (H) 0.61 - 1.24 mg/dL   Glucose, Bld 762 (H) 70 - 99 mg/dL    Comment:  Glucose reference range applies only to samples taken after fasting for at least 8 hours.    Calcium, Ion 0.85 (LL) 1.15 - 1.40 mmol/L   TCO2 21 (L) 22 - 32 mmol/L   Hemoglobin 8.5 (L) 13.0 - 17.0 g/dL   HCT 02.6 (L) 37.8 - 58.8 %   Comment NOTIFIED PHYSICIAN   POC occult blood, ED     Status: None   Collection Time: 11/25/21 10:58 AM  Result Value Ref Range   Fecal Occult Bld NEGATIVE NEGATIVE  Lactic acid, plasma     Status: Abnormal   Collection Time: 11/25/21 11:44 AM  Result Value Ref Range   Lactic Acid, Venous 5.3 (HH) 0.5 - 1.9 mmol/L    Comment: CRITICAL VALUE NOTED.  VALUE IS CONSISTENT WITH PREVIOUSLY REPORTED AND CALLED VALUE. Performed at [facility], [address], [place], [place] [address]   Urinalysis, Routine w reflex microscopic Urine, Clean Catch     Status: Abnormal   Collection Time: 11/25/21  1:00 PM  Result Value Ref Range   Color, Urine YELLOW YELLOW   APPearance HAZY (A) CLEAR   Specific Gravity, Urine 1.011 1.005 - 1.030   pH 7.0 5.0 - 8.0   Glucose, UA 50 (A) NEGATIVE mg/dL   Hgb urine dipstick SMALL (A) NEGATIVE   Bilirubin Urine NEGATIVE NEGATIVE   Ketones, ur NEGATIVE NEGATIVE mg/dL   Protein, ur 412 (A) NEGATIVE mg/dL   Nitrite NEGATIVE NEGATIVE   Leukocytes,Ua NEGATIVE NEGATIVE   RBC / HPF 0-5 0 - 5 RBC/hpf   WBC, UA 0-5 0 - 5 WBC/hpf   Bacteria, UA RARE (A) NONE SEEN   Mucus PRESENT     Comment: Performed at [facility], [address], [place], [place] [address]  Lactic acid, plasma     Status: Abnormal   Collection Time: 11/25/21  3:02 PM  Result Value Ref Range   Lactic Acid, Venous 2.6 (HH) 0.5 - 1.9 mmol/L    Comment: CRITICAL VALUE NOTED.  VALUE IS CONSISTENT WITH PREVIOUSLY REPORTED AND CALLED VALUE. Performed at [facility], [address], [place], [place] [address]    CT ABDOMEN PELVIS WO CONTRAST  Result Date: 11/25/2021 CLINICAL DATA:  Generalized acute abdominal pain. EXAM: CT ABDOMEN AND PELVIS WITHOUT CONTRAST TECHNIQUE: Multidetector CT imaging of the abdomen and pelvis was performed  following the standard protocol without IV contrast. RADIATION DOSE REDUCTION: This exam was performed according to the departmental dose-optimization program which includes automated exposure control, adjustment of the mA and/or kV according to patient size and/or use of iterative reconstruction technique. COMPARISON:  None. FINDINGS: Lower chest: No acute findings. 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 Hepatobiliary: No mass visualized on this unenhanced exam. Gallbladder is unremarkable. No evidence of biliary ductal dilatation. Pancreas: No mass or inflammatory process visualized on this unenhanced exam. Spleen:  Within normal limits in size. Adrenals/Urinary tract: No evidence of urolithiasis or hydronephrosis. Distended urinary bladder noted, which could be due to urinary retention. Stomach/Bowel: Stomach is dilated, however duodenum and remainder of bowel is normal in caliber. This could be due to gastric outlet obstruction or gastroparesis. No evidence of obstruction, inflammatory process, or abnormal fluid collections. Vascular/Lymphatic: No pathologically enlarged lymph nodes identified. No evidence of abdominal aortic aneurysm. Reproductive:  Mildly enlarged prostate. Other:  None. Musculoskeletal:  No suspicious bone lesions identified. IMPRESSION: Dilated stomach, which may be due to gastric outlet obstruction or gastroparesis. No dilated bowel loops.  Mildly enlarged prostate. Distended urinary bladder; urinary retention cannot be excluded. Clinical correlation is recommended. Electronically Signed   By: Danae Orleans M.D.   On: 11/25/2021 12:18   DG Chest Port 1 View  Result Date: 11/25/2021 CLINICAL DATA:  61 year old male with history of weakness and abdominal pain for the past 7 days. EXAM: PORTABLE CHEST 1 VIEW COMPARISON:  No priors. FINDINGS: Lung volumes are normal. Calcified pleural plaques are noted in the right hemithorax. No definite left-sided calcified pleural  plaques are noted. No consolidative airspace disease. No pleural effusions. No pneumothorax. No pulmonary nodule or mass noted. Pulmonary vasculature and the cardiomediastinal silhouette are within normal limits. IMPRESSION: 1. No radiographic evidence of acute cardiopulmonary disease. 2. Calcified right-sided pleural plaques, likely related to remote right-sided empyema or prior right-sided thoracic trauma. Electronically Signed   By: Trudie Reed M.D.   On: 11/25/2021 10:19      Assessment/Plan GOO  - s/p upper endoscopy and biopsy 2/10 Dr. Tomasa Rand; await path -may be able to tolerate liquids and protein shakes since scope was able to traverse vs TNA for nutritional support -may need surgical intervention next week pending path. -we will follow along. -imaging, labs, consult notes, I/Os, vitals all reviewed   FEN - CLD, resource breeze VTE - SCD's, ok for chemical prophylaxis from our standpoint ID - none Admit - on [facility] inpatient service   Moderate Medical Decision Making  Letha Cape, [facility] 11/25/2021, 4:17 PM Please see Amion for pager number during day hours 7:00am-4:30pm or 7:00am -11:30am on weekends

## 2021-11-25 NOTE — Progress Notes (Signed)
New Admission Note:   Arrival Method: stretcher  Mental Orientation: Alert and oriented  Telemetry: Assessment: Completed Skin: ZO:[id number] forearm infusing  Pain: 0/10  Tubes: none  Safety Measures: Safety Fall Prevention Plan has been given, discussed and signed Admission: Completed [facility] Orientation: Patient has been orientated to the room, unit and staff.  Family: none   Orders have been reviewed and implemented. Will continue to monitor the patient. Call light has been placed within reach and bed alarm has been activated.   Annete Ayuso RN [facility] Phone: [phone]

## 2021-11-25 NOTE — ED Provider Notes (Signed)
[facility] EMERGENCY DEPARTMENT Provider Note   CSN: [id number] Arrival date & time: 11/25/21  1914     History  Chief Complaint  Patient presents with   Abdominal Pain    Christian Owens is a 61 y.o. male.  61 year old male with prior medical history as detailed below presents presents for evaluation.  He arrives by EMS from home.  Patient reports roughly 1 to 2 weeks of persistent abdominal pain with associated nausea and vomiting.  Patient reports weight loss.  Patient reports that his symptoms were worse today and he decided to come to the hospital for evaluation.  He denies fever.  He denies bloody emesis.  He denies diarrhea.  EMS reports the patient was hypotensive on their initial evaluation with a systolic of 70.  Patient has received 2 L normal saline while in route.  BP improved.  Patient reports his belly pain is improved with hydration.  Additional history obtained from patient's family who arrived shortly after the patient's initial arrival.  Patient with 1 to 2 weeks of persistent nausea and vomiting.  Patient with weight loss.  Patient may have seen some blood in his emesis intermittently.  No recent bleeding noted. No recent reported ETOH use.   The history is provided by the patient and the EMS personnel.  Abdominal Pain Pain location:  Generalized Pain quality: aching   Pain radiates to:  Does not radiate Pain severity:  Moderate Onset quality:  Gradual Duration:  2 weeks Timing:  Constant Progression:  Worsening     Home Medications Prior to Admission medications   Not on File      Allergies    Patient has no allergy information on record.    Review of Systems   Review of Systems  Gastrointestinal:  Positive for abdominal pain.  All other systems reviewed and are negative.  Physical Exam Updated Vital Signs BP 106/63 (BP Location: Right Arm)    Pulse 83    Temp 97.6 F (36.4 C) (Oral)    Resp 20    SpO2 96%  Physical  Exam Vitals and nursing note reviewed.  Constitutional:      General: He is not in acute distress.    Appearance: Normal appearance. He is ill-appearing.  HENT:     Head: Normocephalic and atraumatic.  Eyes:     Conjunctiva/sclera: Conjunctivae normal.     Pupils: Pupils are equal, round, and reactive to light.  Cardiovascular:     Rate and Rhythm: Normal rate and regular rhythm.     Heart sounds: Normal heart sounds.  Pulmonary:     Effort: Pulmonary effort is normal. No respiratory distress.     Breath sounds: Normal breath sounds.  Abdominal:     General: There is no distension.     Palpations: Abdomen is soft.     Tenderness: There is generalized abdominal tenderness.  Musculoskeletal:        General: No deformity. Normal range of motion.     Cervical back: Normal range of motion and neck supple.  Skin:    General: Skin is warm and dry.  Neurological:     General: No focal deficit present.     Mental Status: He is alert and oriented to person, place, and time.    ED Results / Procedures / Treatments   Labs (all labs ordered are listed, but only abnormal results are displayed) Labs Reviewed  URINE CULTURE  RESP PANEL BY RT-PCR (FLU A&B, COVID) ARPGX2  URINALYSIS, ROUTINE W REFLEX MICROSCOPIC  LACTIC ACID, PLASMA  LACTIC ACID, PLASMA  COMPREHENSIVE METABOLIC PANEL  LIPASE, BLOOD  CBC WITH DIFFERENTIAL/PLATELET  PROTIME-INR  I-STAT CHEM 8, ED  CBG MONITORING, ED  TYPE AND SCREEN    EKG EKG Interpretation  Date/Time:  Friday November 25 2021 09:45:32 EST Ventricular Rate:  76 PR Interval:  153 QRS Duration: 118 QT Interval:  420 QTC Calculation: 473 R Axis:   70 Text Interpretation: Sinus rhythm Incomplete right bundle branch block Confirmed by Kristine Royal [phone]) on 11/25/2021 9:53:18 AM  Radiology No results found.  Procedures Procedures    Medications Ordered in ED Medications  sodium chloride 0.9 % bolus 1,000 mL (has no administration in  time range)    ED Course/ Medical Decision Making/ A&P                           Medical Decision Making Amount and/or Complexity of Data Reviewed Labs: ordered. Radiology: ordered.    Medical Screen Complete  This patient presented to the ED with complaint of abdominal pain, hypotension.  This complaint involves an extensive number of treatment options. The initial differential diagnosis includes, but is not limited to, dehydration, infection, sepsis, AKI, metabolic abnormality, intra-abdominal pathology, etc.  This presentation is: Acute, Chronic, Previously Undiagnosed, Uncertain Prognosis, Complicated, Systemic Symptoms, and Threat to Life/Bodily Function  Patient presents with complaint of worsening nausea, vomiting, abdominal discomfort.  Symptoms of been ongoing for approximately 2 weeks.  Symptoms were worse this morning which resulted in call to EMS.  Patient noted to be hypotensive with EMS on their initial eval.   Patient's symptoms improved significantly with administration of IV fluids.  Patient is noted to have elevated lactic acid, elevated white count, labs suggestive of AKI.  Additionally patient is noted to be anemic with a negative stool guaiac.  No witnessed hematemesis during evaluation.  Broad-spectrum antibiotics initiated here in the ED.  IV fluid resuscitation continued.  CT imaging is suggestive of gastric outlet obstruction.  Patient would benefit from admission for further work-up and treatment.    Hospitalist service is aware of case and will eval.        Additional history obtained:  Additional history obtained from [facility] External records from outside sources obtained and reviewed including prior ED visits and prior Inpatient records.     Lab Tests:  I ordered and personally interpreted labs.  The pertinent results include: CBC, CMP, lipase, lactic acid, his COVID, flu, UA   Imaging Studies ordered:  I ordered imaging studies  including CT abdomen pelvis I independently visualized and interpreted obtained imaging which showed possible gastric outlet obstruction I agree with the radiologist interpretation.   Cardiac Monitoring:  The patient was maintained on a cardiac monitor.  I personally viewed and interpreted the cardiac monitor which showed an underlying rhythm of: NSR   Medicines ordered:  I ordered medication including IV fluids, antibiotic for dehydration, infection Reevaluation of the patient after these medicines showed that the patient: improved   Problem List / ED Course:  Nausea, vomiting, abdominal pain, hypotension, lactic acidosis, elevated white count, AKI.   Reevaluation:  After the interventions noted above, I reevaluated the patient and found that they have: improved    Disposition:  After consideration of the diagnostic results and the patients response to treatment, I feel that the patent would benefit from admission.   CRITICAL CARE Performed by: Wynetta Fines   Total  critical care time: 45 minutes  Critical care time was exclusive of separately billable procedures and treating other patients.  Critical care was necessary to treat or prevent imminent or life-threatening deterioration.  Critical care was time spent personally by me on the following activities: development of treatment plan with patient and/or surrogate as well as nursing, discussions with consultants, evaluation of patient's response to treatment, examination of patient, obtaining history from patient or surrogate, ordering and performing treatments and interventions, ordering and review of laboratory studies, ordering and review of radiographic studies, pulse oximetry and re-evaluation of patient's condition.        Final Clinical Impression(s) / ED Diagnoses Final diagnoses:  Abdominal pain, unspecified abdominal location  Hypotension, unspecified hypotension type  Lactic acidosis  AKI (acute  kidney injury) [facility])    Rx / DC Orders ED Discharge Orders     None         Wynetta Fines, MD 11/25/21 1315

## 2021-11-26 DIAGNOSIS — R748 Abnormal levels of other serum enzymes: Secondary | ICD-10-CM

## 2021-11-26 DIAGNOSIS — N400 Enlarged prostate without lower urinary tract symptoms: Secondary | ICD-10-CM

## 2021-11-26 DIAGNOSIS — R6510 Systemic inflammatory response syndrome (SIRS) of non-infectious origin without acute organ dysfunction: Secondary | ICD-10-CM

## 2021-11-26 DIAGNOSIS — K250 Acute gastric ulcer with hemorrhage: Secondary | ICD-10-CM

## 2021-11-26 DIAGNOSIS — D62 Acute posthemorrhagic anemia: Secondary | ICD-10-CM

## 2021-11-26 DIAGNOSIS — I959 Hypotension, unspecified: Secondary | ICD-10-CM

## 2021-11-26 DIAGNOSIS — D72829 Elevated white blood cell count, unspecified: Secondary | ICD-10-CM

## 2021-11-26 LAB — CBC
HCT: 19.7 % — ABNORMAL LOW (ref 39.0–52.0)
Hemoglobin: 6.2 g/dL — CL (ref 13.0–17.0)
MCH: 26.8 pg (ref 26.0–34.0)
MCHC: 31.5 g/dL (ref 30.0–36.0)
MCV: 85.3 fL (ref 80.0–100.0)
Platelets: 291 10*3/uL (ref 150–400)
RBC: 2.31 MIL/uL — ABNORMAL LOW (ref 4.22–5.81)
RDW: 17.1 % — ABNORMAL HIGH (ref 11.5–15.5)
WBC: 12.9 10*3/uL — ABNORMAL HIGH (ref 4.0–10.5)
nRBC: 0 % (ref 0.0–0.2)

## 2021-11-26 LAB — HEMOGLOBIN AND HEMATOCRIT, BLOOD
HCT: 24.9 % — ABNORMAL LOW (ref 39.0–52.0)
Hemoglobin: 8.1 g/dL — ABNORMAL LOW (ref 13.0–17.0)

## 2021-11-26 LAB — COMPREHENSIVE METABOLIC PANEL
ALT: 68 U/L — ABNORMAL HIGH (ref 0–44)
AST: 95 U/L — ABNORMAL HIGH (ref 15–41)
Albumin: 2.1 g/dL — ABNORMAL LOW (ref 3.5–5.0)
Alkaline Phosphatase: 54 U/L (ref 38–126)
Anion gap: 6 (ref 5–15)
BUN: 21 mg/dL (ref 8–23)
CO2: 25 mmol/L (ref 22–32)
Calcium: 7.9 mg/dL — ABNORMAL LOW (ref 8.9–10.3)
Chloride: 107 mmol/L (ref 98–111)
Creatinine, Ser: 1.1 mg/dL (ref 0.61–1.24)
GFR, Estimated: >60 mL/min (ref >60)
Glucose, Bld: 259 mg/dL — ABNORMAL HIGH (ref 70–99)
Potassium: 3.9 mmol/L (ref 3.5–5.1)
Sodium: 138 mmol/L (ref 135–145)
Total Bilirubin: 0.6 mg/dL (ref 0.3–1.2)
Total Protein: 5 g/dL — ABNORMAL LOW (ref 6.5–8.1)

## 2021-11-26 LAB — PREPARE RBC (CROSSMATCH)

## 2021-11-26 MED ORDER — SODIUM CHLORIDE 0.9% IV SOLUTION: Freq: Once | INTRAVENOUS | Status: AC

## 2021-11-26 NOTE — Progress Notes (Signed)
Progress Note   Subjective  No abdominal pain or bleeding reported. He is tolerating clears.    Objective  Vital signs in last 24 hours: Temp:  [98.2 F (36.8 C)-98.6 F (37 C)] 98.2 F (36.8 C) (02/11 0933) Pulse Rate:  [61-77] 61 (02/11 0933) Resp:  [16-20] 18 (02/11 0933) BP: (85-122)/(44-97) 99/55 (02/11 0933) SpO2:  [93 %-100 %] 100 % (02/11 0933) Weight:  [43.2 kg-48.8 kg] 43.2 kg (02/10 1731) Last BM Date: 11/25/21  General: Alert, well-developed, thin, in NAD Heart:  Regular rate and rhythm; no murmurs Chest: Clear to ascultation bilaterally Abdomen:  Soft, nontender and nondistended. Normal bowel sounds, without guarding, and without rebound.   Extremities:  Without edema. Neurologic:  Alert and  oriented x4; grossly normal neurologically. Psych:  Alert and cooperative. Normal mood and affect.  Intake/Output from previous day: 02/10 0701 - 02/11 0700 In: 3497.4 [P.O.:500; I.V.:1500.5; IV Piggyback:1496.9] Out: 700 [Urine:700] Intake/Output this shift: Total I/O In: 892.5 [P.O.:480; Blood:412.5] Out: -   Lab Results: Recent Labs    11/25/21 1030 11/25/21 1047 11/26/21 0100 11/26/21 0752  WBC 21.9*  --  12.9*  --   HGB 7.7* 8.5* 6.2* 8.1*  HCT 25.7* 25.0* 19.7* 24.9*  PLT 335  --  291  --    BMET Recent Labs    11/25/21 1030 11/25/21 1047 11/26/21 0100  NA 140 138 138  K 3.9 4.1 3.9  CL 106 104 107  CO2 18*  --  25  GLUCOSE 155* 149* 259*  BUN 24* 27* 21  CREATININE 1.95* 1.90* 1.10  CALCIUM 7.2*  --  7.9*   LFT Recent Labs    11/26/21 0100  PROT 5.0*  ALBUMIN 2.1*  AST 95*  ALT 68*  ALKPHOS 54  BILITOT 0.6   PT/INR Recent Labs    11/25/21 1030  LABPROT 16.7*  INR 1.4*    Studies/Results: CT ABDOMEN PELVIS WO CONTRAST  Result Date: 11/25/2021 CLINICAL DATA:  Generalized acute abdominal pain. EXAM: CT ABDOMEN AND PELVIS WITHOUT CONTRAST TECHNIQUE: Multidetector CT imaging of the abdomen and pelvis was performed following  the standard protocol without IV contrast. RADIATION DOSE REDUCTION: This exam was performed according to the departmental dose-optimization program which includes automated exposure control, adjustment of the mA and/or kV according to patient size and/or use of iterative reconstruction technique. COMPARISON:  None. FINDINGS: Lower chest: No acute findings. 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 Hepatobiliary: No mass visualized on this unenhanced exam. Gallbladder is unremarkable. No evidence of biliary ductal dilatation. Pancreas: No mass or inflammatory process visualized on this unenhanced exam. Spleen:  Within normal limits in size. Adrenals/Urinary tract: No evidence of urolithiasis or hydronephrosis. Distended urinary bladder noted, which could be due to urinary retention. Stomach/Bowel: Stomach is dilated, however duodenum and remainder of bowel is normal in caliber. This could be due to gastric outlet obstruction or gastroparesis. No evidence of obstruction, inflammatory process, or abnormal fluid collections. Vascular/Lymphatic: No pathologically enlarged lymph nodes identified. No evidence of abdominal aortic aneurysm. Reproductive:  Mildly enlarged prostate. Other:  None. Musculoskeletal:  No suspicious bone lesions identified. IMPRESSION: Dilated stomach, which may be due to gastric outlet obstruction or gastroparesis. No dilated bowel loops. Mildly enlarged prostate. Distended urinary bladder; urinary retention cannot be excluded. Clinical correlation is recommended. Electronically Signed   By: Danae Orleans M.D.   On: 11/25/2021 12:18   DG Chest Port 1 View  Result Date: 11/25/2021 CLINICAL DATA:  61 year old  male with history of weakness and abdominal pain for the past 7 days. EXAM: PORTABLE CHEST 1 VIEW COMPARISON:  No priors. FINDINGS: Lung volumes are normal. Calcified pleural plaques are noted in the right hemithorax. No definite left-sided calcified pleural plaques are  noted. No consolidative airspace disease. No pleural effusions. No pneumothorax. No pulmonary nodule or mass noted. Pulmonary vasculature and the cardiomediastinal silhouette are within normal limits. IMPRESSION: 1. No radiographic evidence of acute cardiopulmonary disease. 2. Calcified right-sided pleural plaques, likely related to remote right-sided empyema or prior right-sided thoracic trauma. Electronically Signed   By: Trudie Reed M.D.   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 Continue IV pantoprazole bid and sucralfate po qid. Continue clear liquids for now. Consider full liquids tomorrow. R/O malignant ulcer. Await biopsies. Appreciate surgical consult.  LA Class A esophagitis. Pantoprazole as above.  2.   ABL anemia. Hgb improved to 8.1 post transfusion. Trend CBC.     LOS: 1 day   Jaron Czarnecki T. Russella Dar MD 11/26/2021, 12:09 PM See Loretha Stapler, [facility], to contact our on call provider

## 2021-11-26 NOTE — Assessment & Plan Note (Addendum)
-  Secondary to GI bleed.  Hemoglobin low at 6.2 the lowest.  1 unit of PRBC was transfused. Continue to monitor CBC.  Transfuse to keep more than 7.  Hemoglobin 8.1 today.  Recent Labs    11/26/21 0100 11/26/21 0752 11/27/21 0245 11/28/21 0342 11/29/21 0339  HGB 6.2* 8.1* 7.2* 8.1* 8.2*  MCV 85.3  --  83.3 83.5 85.6

## 2021-11-26 NOTE — Assessment & Plan Note (Addendum)
-  Patient was hypotensive on arrival due to dehydration.  With hydration, blood pressure improved.  He has a low BMI of 18.  Wonder if his blood pressure usually runs low.  Adequately hydrated.  We will stop IV fluid today.

## 2021-11-26 NOTE — Assessment & Plan Note (Addendum)
-  AST/ ALT improving.  Low albumin probably because of poor nutrition.  LFTs improving.

## 2021-11-26 NOTE — Assessment & Plan Note (Signed)
-  CT abdomen on admission showed mildly large prostate and distended urinary bladder.  Once blood pressure improves, will start on Flomax.

## 2021-11-26 NOTE — Progress Notes (Addendum)
PROGRESS NOTE  Christian Owens  DOB: 08-10-1961  PCP: Kathyrn Lass OMV:[id number]  DOA: 11/25/2021  LOS: 1 day  Hospital Day: 2  Brief narrative: Christian Owens is a 61 y.o. male of [place] origin with PMH significant for GERD. Patient presented to the ED on 2/10 with complaint of nausea, vomiting, abdominal pain.  He has chronic mild abdominal discomfort due to reflux for which he is on a medication..  In the last few weeks, pain has progressively worsened and he has worsening oral intolerance.  Reports 30 pound of unintentional weight loss in the interval. EMS noted his blood pressure low at 70s and gave him 2 L normal saline with improvement in blood pressure to 96/50.  In the ED, patient was afebrile, heart rate in 80s, blood pressure in low 100s, breathing on room air. Initial lab with BUN/creatinine 24/1.95, AST/ALT slightly elevated to 75/52 with normal alk phos and bilirubin, INR 1.4. Lactic acid level significantly elevated to 8.5, WBC count elevated to 21.9, hemoglobin low at 7.7. CT abdomen showed dilated stomach suggestive of GOO or gastroparesis.  No evidence of dilated bowel loops. Admitted to hospitalist service GI was consulted.  Patient underwent emergent EGD. See below for details.  Subjective: Patient was seen and examined this morning.  Middle-aged male.  Thin build.  Not in distress. Labs this morning with creatinine significantly improved down to normal at 1.1, AST/ALT slightly up, lactic acid gradually trending down, 2.8 on the last check, hemoglobin was low at 6.2 this morning  Assessment/Plan:  Assessment and Plan: * Gastric outlet obstruction- (present on admission) -Presented with history of GERD, 2 weeks of abdominal pain, nausea, vomiting, poor oral tolerance, weight loss.  CT findings as above showing possible GOO.  GI consult was obtained -Underwent EGD that showed findings as below LA Grade A reflux esophagitis with no bleeding. Large, friable, cratered gastric  ulcer involving the incisura/antrum/prepylorus with contact oozing and copious clot with evidence of recent bleeding. Biopsied. Suspicious for malignancy given size and friability Stenosis was found at the pylorus secondary to the ulcer. -Currently on Protonix 40 mg IV twice daily, Carafate suspension -General surgery consulted.  Waiting for pathology report. -Continue to monitor CBC.  Currently on clears liquid diet.  ABLA (acute blood loss anemia) -Secondary to GI bleed.  Hemoglobin low at 6.2 the lowest.  1 unit of PRBC transfused this morning.  Recent Labs    11/25/21 1030 11/25/21 1047 11/26/21 0100 11/26/21 0752  HGB 7.7* 8.5* 6.2* 8.1*  MCV 91.1  --  85.3  --      SIRS (systemic inflammatory response syndrome) (HCC) Leukocytosis/lactic acidosis -On admission, patient has significant elevated WBC count and lactic acid level with no evidence of infection.  Likely due to dehydration, prolonged hypotension and AKI. -Reviewed labs with IV hydration.  Continue to monitor Recent Labs  Lab 11/25/21 1030 11/25/21 1144 11/25/21 1502 11/25/21 1945 11/26/21 0100  WBC 21.9*  --   --   --  12.9*  LATICACIDVEN 8.5* 5.3* 2.6* 2.8*  --      Hypotension -Patient was hypotensive on arrival due to dehydration.  Blood pressure improved with hydration.  But running low again between 80s this morning.  He is not tachycardic.  He has a low BMI of 18.  Wonder if his blood pressure usually runs low. Currently on LR at 100 mL/h.  AKI (acute kidney injury) ([place]) -Creatinine elevated to 1.95 due to prolonged hypotension -Improved to normal with IV hydration Recent Labs  11/25/21 1030 11/25/21 1047 11/26/21 0100  BUN 24* 27* 21  CREATININE 1.95* 1.90* 1.10     Elevated liver enzymes -AST ALT slightly up today.  INR slightly up to 1.4.  Low albumin probably because of poor nutrition.  Continue to monitor. Recent Labs  Lab 11/25/21 1030 11/26/21 0100  AST 75* 95*  ALT 52* 68*   ALKPHOS 63 54  BILITOT 0.7 0.6  PROT 5.4* 5.0*  ALBUMIN 2.4* 2.1*  INR 1.4*  --   LIPASE 30  --     Hypocalcemia- (present on admission) -Was low at 7.2 on admission.  1 dose of IV calcium gluconate was given.  Improved to 7.9 today.  Corrected calcium level is in normal range. Recent Labs  Lab 11/25/21 1030 11/26/21 0100  CALCIUM 7.2* 7.9*    BPH (benign prostatic hyperplasia) -CT abdomen on admission showed mildly large prostate and distended urinary bladder.  Once blood pressure improves, will start on Flomax.      Mobility: Encourage ambulation Goals of care   Code Status: Full Code    Nutritional status:  Body mass index is 18 kg/m.      Diet:  Diet Order             Diet clear liquid Room service appropriate? Yes; Fluid consistency: Thin  Diet effective now                   DVT prophylaxis:  SCDs Start: 11/25/21 1406   Antimicrobials: None Fluid: LR at 100 ml/h Consultants: GI Family Communication: None at bedside  Status is: Inpatient  Continue in-hospital care because: Pending gastric biopsy report Level of care: Med-Surg   Dispo: The patient is from: Home              Anticipated d/c is to: Depends on clinical course              Patient currently is not medically stable to d/c.   Difficult to place patient No     Infusions:   lactated ringers 100 mL/hr at 11/26/21 1153    Scheduled Meds:  feeding supplement  1 Container Oral TID BM   pantoprazole (PROTONIX) IV  40 mg Intravenous Q12H   sucralfate  1 g Oral Q6H    PRN meds: acetaminophen **OR** acetaminophen, hydrALAZINE, morphine injection, ondansetron **OR** ondansetron (ZOFRAN) IV, oxyCODONE   Antimicrobials: Anti-infectives (From admission, onward)    Start     Dose/Rate Route Frequency Ordered Stop   11/27/21 0100  vancomycin (VANCOREADY) IVPB 750 mg/150 mL  Status:  Discontinued        750 mg 150 mL/hr over 60 Minutes Intravenous Every 36 hours 11/25/21 1138  11/25/21 1406   11/25/21 1145  vancomycin (VANCOCIN) IVPB 1000 mg/200 mL premix        1,000 mg 200 mL/hr over 60 Minutes Intravenous  Once 11/25/21 1137 11/25/21 1335   11/25/21 1145  ceFEPIme (MAXIPIME) 2 g in sodium chloride 0.9 % 100 mL IVPB  Status:  Discontinued        2 g 200 mL/hr over 30 Minutes Intravenous Every 24 hours 11/25/21 1137 11/25/21 1406   11/25/21 1130  metroNIDAZOLE (FLAGYL) IVPB 500 mg        500 mg 100 mL/hr over 60 Minutes Intravenous  Once 11/25/21 1125 11/25/21 1334       Objective: Vitals:   11/26/21 0933 11/26/21 1640  BP: (!) 99/55 (!) 89/58  Pulse: 61 61  Resp: 18  18  Temp: 98.2 F (36.8 C) 98.6 F (37 C)  SpO2: 100% 100%    Intake/Output Summary (Last 24 hours) at 11/26/2021 1647 Last data filed at 11/26/2021 1300 Gross per 24 hour  Intake 2355.31 ml  Output 375 ml  Net 1980.31 ml   Filed Weights   11/25/21 1113 11/25/21 1509 11/25/21 1731  Weight: 48.8 kg 48.8 kg 43.2 kg   Weight change:  Body mass index is 18 kg/m.   Physical Exam: General exam: Pleasant middle-aged male.  Not in physical distress Skin: No rashes, lesions or ulcers. HEENT: Atraumatic, normocephalic, no obvious bleeding Lungs: Clear to auscultation bilaterally CVS: Regular rate and rhythm, no murmur GI/Abd soft, nondistended, mild epigastric tenderness, bowel sound present CNS: Alert, awake oriented x3 Psychiatry: Mood appropriate Extremities: No pedal edema, no calf tenderness.  Data Review: I have personally reviewed the laboratory data and studies available.  F/u labs ordered Unresulted Labs (From admission, onward)     Start     Ordered   11/27/21 0500  CBC with Differential/Platelet  Daily,   R      11/26/21 0843   11/27/21 0500  Comprehensive metabolic panel  Daily,   R      11/26/21 0843   11/26/21 0500  Calcium, ionized  Tomorrow morning,   R        11/25/21 1410            Signed, Terrilee Croak, MD [facility] 11/26/2021

## 2021-11-26 NOTE — Assessment & Plan Note (Addendum)
Leukocytosis/lactic acidosis -On admission, patient has significant elevated WBC count and lactic acid level with no evidence of infection.  Likely due to dehydration, prolonged hypotension and AKI. -Labs improved with IV hydration.

## 2021-11-26 NOTE — Plan of Care (Signed)
°  Problem: Education: Goal: Knowledge of General Education information will improve Description: Including pain rating scale, medication(s)/side effects and non-pharmacologic comfort measures Outcome: Not Progressing   Problem: Clinical Measurements: Goal: Ability to maintain clinical measurements within normal limits will improve Outcome: Not Progressing Goal: Will remain free from infection Outcome: Not Progressing Goal: Diagnostic test results will improve Outcome: Not Progressing Goal: Respiratory complications will improve Outcome: Not Progressing Goal: Cardiovascular complication will be avoided Outcome: Not Progressing   Problem: Activity: Goal: Risk for activity intolerance will decrease Outcome: Not Progressing   Problem: Nutrition: Goal: Adequate nutrition will be maintained Outcome: Not Progressing   Problem: Coping: Goal: Level of anxiety will decrease Outcome: Not Progressing

## 2021-11-27 ENCOUNTER — Encounter (HOSPITAL_COMMUNITY): Payer: Self-pay | Admitting: Gastroenterology

## 2021-11-27 LAB — CBC WITH DIFFERENTIAL/PLATELET
Abs Immature Granulocytes: 0.09 10*3/uL — ABNORMAL HIGH (ref 0.00–0.07)
Basophils Absolute: 0 10*3/uL (ref 0.0–0.1)
Basophils Relative: 0 %
Eosinophils Absolute: 0 10*3/uL (ref 0.0–0.5)
Eosinophils Relative: 0 %
HCT: 21 % — ABNORMAL LOW (ref 39.0–52.0)
Hemoglobin: 7.2 g/dL — ABNORMAL LOW (ref 13.0–17.0)
Immature Granulocytes: 1 %
Lymphocytes Relative: 10 %
Lymphs Abs: 1.2 10*3/uL (ref 0.7–4.0)
MCH: 28.6 pg (ref 26.0–34.0)
MCHC: 34.3 g/dL (ref 30.0–36.0)
MCV: 83.3 fL (ref 80.0–100.0)
Monocytes Absolute: 0.9 10*3/uL (ref 0.1–1.0)
Monocytes Relative: 8 %
Neutro Abs: 9.1 10*3/uL — ABNORMAL HIGH (ref 1.7–7.7)
Neutrophils Relative %: 81 %
Platelets: 290 10*3/uL (ref 150–400)
RBC: 2.52 MIL/uL — ABNORMAL LOW (ref 4.22–5.81)
RDW: 16.4 % — ABNORMAL HIGH (ref 11.5–15.5)
WBC: 11.2 10*3/uL — ABNORMAL HIGH (ref 4.0–10.5)
nRBC: 0 % (ref 0.0–0.2)

## 2021-11-27 LAB — URINE CULTURE: Culture: 10000 — AB

## 2021-11-27 LAB — CALCIUM, IONIZED: Calcium, Ionized, Serum: 4.8 mg/dL (ref 4.5–5.6)

## 2021-11-27 LAB — COMPREHENSIVE METABOLIC PANEL
ALT: 54 U/L — ABNORMAL HIGH (ref 0–44)
AST: 47 U/L — ABNORMAL HIGH (ref 15–41)
Albumin: 2 g/dL — ABNORMAL LOW (ref 3.5–5.0)
Alkaline Phosphatase: 45 U/L (ref 38–126)
Anion gap: 7 (ref 5–15)
BUN: 11 mg/dL (ref 8–23)
CO2: 25 mmol/L (ref 22–32)
Calcium: 7.7 mg/dL — ABNORMAL LOW (ref 8.9–10.3)
Chloride: 105 mmol/L (ref 98–111)
Creatinine, Ser: 0.84 mg/dL (ref 0.61–1.24)
GFR, Estimated: >60 mL/min (ref >60)
Glucose, Bld: 193 mg/dL — ABNORMAL HIGH (ref 70–99)
Potassium: 2.9 mmol/L — ABNORMAL LOW (ref 3.5–5.1)
Sodium: 137 mmol/L (ref 135–145)
Total Bilirubin: 0.5 mg/dL (ref 0.3–1.2)
Total Protein: 4.4 g/dL — ABNORMAL LOW (ref 6.5–8.1)

## 2021-11-27 LAB — TYPE AND SCREEN
ABO/RH(D): O POS
Antibody Screen: NEGATIVE
Unit division: 0

## 2021-11-27 LAB — BPAM RBC
Blood Product Expiration Date: 202302162359
ISSUE DATE / TIME: 202302110408
Unit Type and Rh: 9500

## 2021-11-27 LAB — LACTIC ACID, PLASMA: Lactic Acid, Venous: 1.8 mmol/L (ref 0.5–1.9)

## 2021-11-27 MED ORDER — POTASSIUM CHLORIDE CRYS ER 20 MEQ PO TBCR
40.0000 meq | EXTENDED_RELEASE_TABLET | ORAL | Status: AC
  Administered 2021-11-27 (×3): 40 meq via ORAL
  Filled 2021-11-27 (×3): qty 2

## 2021-11-27 NOTE — Progress Notes (Signed)
PROGRESS NOTE  Christian Owens  DOB: 20-Mar-1961  PCP: Kathyrn Lass LGX:[id number]  DOA: 11/25/2021  LOS: 2 days  Hospital Day: 3  Brief narrative: Christian Owens is a 61 y.o. male of [place] origin with PMH significant for GERD. Patient presented to the ED on 2/10 with complaint of nausea, vomiting, abdominal pain.  He has chronic mild abdominal discomfort due to reflux for which he is on a medication..  In the last few weeks, pain has progressively worsened and he has worsening oral intolerance.  Reports 30 pound of unintentional weight loss in the interval. EMS noted his blood pressure low at 70s and gave him 2 L normal saline with improvement in blood pressure to 96/50.  In the ED, patient was afebrile, heart rate in 80s, blood pressure in low 100s, breathing on room air. Initial lab with BUN/creatinine 24/1.95, AST/ALT slightly elevated to 75/52 with normal alk phos and bilirubin, INR 1.4. Lactic acid level significantly elevated to 8.5, WBC count elevated to 21.9, hemoglobin low at 7.7. CT abdomen showed dilated stomach suggestive of GOO or gastroparesis.  No evidence of dilated bowel loops. Admitted to hospitalist service GI was consulted.  Patient underwent emergent EGD. See below for details.  Subjective: Patient was seen and examined this morning.   Sitting up in bed.  Not in distress.  No new symptoms.  Abdominal pain improving.  On clear liquid diet.  Assessment/Plan:  Assessment and Plan: * Gastric outlet obstruction- (present on admission) -Presented with history of GERD, 2 weeks of abdominal pain, nausea, vomiting, poor oral tolerance, weight loss.  CT findings as above showing possible GOO.  GI consult was obtained -Underwent EGD that showed findings as below LA Grade A reflux esophagitis with no bleeding. Large, friable, cratered gastric ulcer involving the incisura/antrum/prepylorus with contact oozing and copious clot with evidence of recent bleeding. Biopsied. Suspicious for  malignancy given size and friability Stenosis was found at the pylorus secondary to the ulcer. -Currently on Protonix 40 mg IV twice daily, Carafate suspension -General surgery consulted.  Waiting for pathology report. -Continue to monitor CBC.  Currently on clears liquid diet.  ABLA (acute blood loss anemia) -Secondary to GI bleed.  Hemoglobin low at 6.2 the lowest.  1 unit of PRBC was transfused.  In last 24 hours, hemoglobin dropped from 8.1 to 7.2.  Continue to monitor CBC.  Transfuse to keep more than 7.  Recent Labs    11/25/21 1030 11/25/21 1047 11/26/21 0100 11/26/21 0752 11/27/21 0245  HGB 7.7* 8.5* 6.2* 8.1* 7.2*  MCV 91.1  --  85.3  --  83.3     SIRS (systemic inflammatory response syndrome) (HCC) Leukocytosis/lactic acidosis -On admission, patient has significant elevated WBC count and lactic acid level with no evidence of infection.  Likely due to dehydration, prolonged hypotension and AKI. -Reviewed labs with IV hydration.  WBC count improving, lactic acid down to normal.  Continue to monitor Recent Labs  Lab 11/25/21 1030 11/25/21 1144 11/25/21 1502 11/25/21 1945 11/26/21 0100 11/27/21 0245  WBC 21.9*  --   --   --  12.9* 11.2*  LATICACIDVEN 8.5* 5.3* 2.6* 2.8*  --  1.8     Hypotension -Patient was hypotensive on arrival due to dehydration.  Blood pressure improved with hydration.  But running low again between 80s this morning.  He is not tachycardic.  He has a low BMI of 18.  Wonder if his blood pressure usually runs low. Currently on LR at 100 mL/h.  AKI (  acute kidney injury) ([place]) -Creatinine elevated to 1.95 due to prolonged hypotension -Improved to normal with IV hydration Recent Labs    11/25/21 1030 11/25/21 1047 11/26/21 0100  BUN 24* 27* 21  CREATININE 1.95* 1.90* 1.10     Elevated liver enzymes -AST ALT improving.  Low albumin probably because of poor nutrition.  Continue to monitor. Recent Labs  Lab 11/25/21 1030 11/26/21 0100  11/27/21 0245  AST 75* 95* 47*  ALT 52* 68* 54*  ALKPHOS 63 54 45  BILITOT 0.7 0.6 0.5  PROT 5.4* 5.0* 4.4*  ALBUMIN 2.4* 2.1* 2.0*  INR 1.4*  --   --   LIPASE 30  --   --     Hypocalcemia- (present on admission) -Was low at 7.2 on admission.  1 dose of IV calcium gluconate was given.  Improved to 7.9 on last check on 2/11..  Corrected calcium level is in normal range. Recent Labs  Lab 11/25/21 1030 11/26/21 0100  CALCIUM 7.2* 7.9*    BPH (benign prostatic hyperplasia) -CT abdomen on admission showed mildly large prostate and distended urinary bladder.  Once blood pressure improves, will start on Flomax.      Mobility: Encourage ambulation Goals of care   Code Status: Full Code    Nutritional status:  Body mass index is 18 kg/m.      Diet:  Diet Order             Diet clear liquid Room service appropriate? Yes; Fluid consistency: Thin  Diet effective now                   DVT prophylaxis:  SCDs Start: 11/25/21 1406   Antimicrobials: None Fluid: LR at 100 ml/h Consultants: GI Family Communication: None at bedside  Status is: Inpatient  Continue in-hospital care because: Pending gastric biopsy report Level of care: Med-Surg   Dispo: The patient is from: Home              Anticipated d/c is to: Depends on clinical course              Patient currently is not medically stable to d/c.   Difficult to place patient No     Infusions:   lactated ringers 100 mL/hr at 11/27/21 0720    Scheduled Meds:  feeding supplement  1 Container Oral TID BM   pantoprazole (PROTONIX) IV  40 mg Intravenous Q12H   sucralfate  1 g Oral Q6H    PRN meds: acetaminophen **OR** acetaminophen, hydrALAZINE, morphine injection, ondansetron **OR** ondansetron (ZOFRAN) IV, oxyCODONE   Antimicrobials: Anti-infectives (From admission, onward)    Start     Dose/Rate Route Frequency Ordered Stop   11/27/21 0100  vancomycin (VANCOREADY) IVPB 750 mg/150 mL  Status:   Discontinued        750 mg 150 mL/hr over 60 Minutes Intravenous Every 36 hours 11/25/21 1138 11/25/21 1406   11/25/21 1145  vancomycin (VANCOCIN) IVPB 1000 mg/200 mL premix        1,000 mg 200 mL/hr over 60 Minutes Intravenous  Once 11/25/21 1137 11/25/21 1335   11/25/21 1145  ceFEPIme (MAXIPIME) 2 g in sodium chloride 0.9 % 100 mL IVPB  Status:  Discontinued        2 g 200 mL/hr over 30 Minutes Intravenous Every 24 hours 11/25/21 1137 11/25/21 1406   11/25/21 1130  metroNIDAZOLE (FLAGYL) IVPB 500 mg        500 mg 100 mL/hr over 60 Minutes Intravenous  Once 11/25/21 1125 11/25/21 1334       Objective: Vitals:   11/27/21 0412 11/27/21 0925  BP: (!) 90/58 107/68  Pulse: 62 (!) 55  Resp: 17 18  Temp: 98.1 F (36.7 C) 98.1 F (36.7 C)  SpO2: 99% 100%    Intake/Output Summary (Last 24 hours) at 11/27/2021 1141 Last data filed at 11/27/2021 1000 Gross per 24 hour  Intake 5321.61 ml  Output 1075 ml  Net 4246.61 ml   Filed Weights   11/25/21 1113 11/25/21 1509 11/25/21 1731  Weight: 48.8 kg 48.8 kg 43.2 kg   Weight change:  Body mass index is 18 kg/m.   Physical Exam: General exam: Pleasant middle-aged male.  Not in physical distress Skin: No rashes, lesions or ulcers. HEENT: Atraumatic, normocephalic, no obvious bleeding Lungs: Clear to auscultation bilaterally CVS: Regular rate and rhythm, no murmur GI/Abd soft, nondistended, mild epigastric tenderness, bowel sound present CNS: Alert, awake oriented x3 Psychiatry: Mood appropriate Extremities: No pedal edema, no calf tenderness.  Data Review: I have personally reviewed the laboratory data and studies available.  F/u labs ordered Unresulted Labs (From admission, onward)     Start     Ordered   11/27/21 0500  CBC with Differential/Platelet  Daily,   R      11/26/21 0843   11/27/21 0500  Comprehensive metabolic panel  Daily,   R      11/26/21 0843   11/26/21 0500  Calcium, ionized  Tomorrow morning,   R         11/25/21 1410            Signed, Terrilee Croak, MD [facility] 11/27/2021

## 2021-11-27 NOTE — Progress Notes (Signed)
° ° °  Progress Note   Subjective  Tolerating clears without N/V. Abdominal pain improving.    Objective  Vital signs in last 24 hours: Temp:  [97.5 F (36.4 C)-98.6 F (37 C)] 98.1 F (36.7 C) (02/12 0925) Pulse Rate:  [55-66] 55 (02/12 0925) Resp:  [17-18] 18 (02/12 0925) BP: (89-107)/(49-68) 107/68 (02/12 0925) SpO2:  [99 %-100 %] 100 % (02/12 0925) Last BM Date: 11/25/21  General: Alert, well-developed, thin, in NAD Heart:  Regular rate and rhythm; no murmurs Chest: Clear to ascultation bilaterally Abdomen:  Soft, nontender and nondistended. Normal bowel sounds, without guarding, and without rebound.   Extremities:  Without edema. Neurologic:  Alert and  oriented x4; grossly normal neurologically. Psych:  Alert and cooperative. Normal mood and affect.  Intake/Output from previous day: 02/11 0701 - 02/12 0700 In: 5267.4 [P.O.:1717; I.V.:3137.9; Blood:412.5] Out: 1075 [Urine:1075] Intake/Output this shift: Total I/O In: 946.7 [P.O.:600; I.V.:346.7] Out: -   Lab Results: Recent Labs    11/25/21 1030 11/25/21 1047 11/26/21 0100 11/26/21 0752 11/27/21 0245  WBC 21.9*  --  12.9*  --  11.2*  HGB 7.7*   < > 6.2* 8.1* 7.2*  HCT 25.7*   < > 19.7* 24.9* 21.0*  PLT 335  --  291  --  290   < > = values in this interval not displayed.   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 No recurrent bleeding. Continue IV pantoprazole bid and sucralfate po qid.  Advance to full liquids today. R/O malignant ulcer. Await biopsies.  LA Class A esophagitis. Pantoprazole as above.  3.   ABL anemia. Hgb drifted to 7.2. Trend CBC. Transfuse for Hgb < 7. 4.   Hypokalemia, per primary service.  Dr. Barron Alvine is covering starting on Monday.    LOS: 2 days   Judie Petit T. Russella Dar MD  11/27/2021, 12:25 PM See Loretha Stapler, [facility], to contact our on call provider

## 2021-11-28 DIAGNOSIS — D62 Acute posthemorrhagic anemia: Secondary | ICD-10-CM

## 2021-11-28 DIAGNOSIS — K311 Adult hypertrophic pyloric stenosis: Principal | ICD-10-CM

## 2021-11-28 DIAGNOSIS — K259 Gastric ulcer, unspecified as acute or chronic, without hemorrhage or perforation: Secondary | ICD-10-CM

## 2021-11-28 LAB — COMPREHENSIVE METABOLIC PANEL
ALT: 54 U/L — ABNORMAL HIGH (ref 0–44)
AST: 45 U/L — ABNORMAL HIGH (ref 15–41)
Albumin: 2.1 g/dL — ABNORMAL LOW (ref 3.5–5.0)
Alkaline Phosphatase: 54 U/L (ref 38–126)
Anion gap: 6 (ref 5–15)
BUN: <5 mg/dL — ABNORMAL LOW (ref 8–23)
CO2: 28 mmol/L (ref 22–32)
Calcium: 7.9 mg/dL — ABNORMAL LOW (ref 8.9–10.3)
Chloride: 105 mmol/L (ref 98–111)
Creatinine, Ser: 0.86 mg/dL (ref 0.61–1.24)
GFR, Estimated: >60 mL/min (ref >60)
Glucose, Bld: 200 mg/dL — ABNORMAL HIGH (ref 70–99)
Potassium: 3.6 mmol/L (ref 3.5–5.1)
Sodium: 139 mmol/L (ref 135–145)
Total Bilirubin: 0.3 mg/dL (ref 0.3–1.2)
Total Protein: 4.6 g/dL — ABNORMAL LOW (ref 6.5–8.1)

## 2021-11-28 LAB — CBC WITH DIFFERENTIAL/PLATELET
Abs Immature Granulocytes: 0.03 10*3/uL (ref 0.00–0.07)
Basophils Absolute: 0 10*3/uL (ref 0.0–0.1)
Basophils Relative: 0 %
Eosinophils Absolute: 0.2 10*3/uL (ref 0.0–0.5)
Eosinophils Relative: 3 %
HCT: 23.7 % — ABNORMAL LOW (ref 39.0–52.0)
Hemoglobin: 8.1 g/dL — ABNORMAL LOW (ref 13.0–17.0)
Immature Granulocytes: 1 %
Lymphocytes Relative: 27 %
Lymphs Abs: 1.5 10*3/uL (ref 0.7–4.0)
MCH: 28.5 pg (ref 26.0–34.0)
MCHC: 34.2 g/dL (ref 30.0–36.0)
MCV: 83.5 fL (ref 80.0–100.0)
Monocytes Absolute: 0.7 10*3/uL (ref 0.1–1.0)
Monocytes Relative: 13 %
Neutro Abs: 3.1 10*3/uL (ref 1.7–7.7)
Neutrophils Relative %: 56 %
Platelets: 286 10*3/uL (ref 150–400)
RBC: 2.84 MIL/uL — ABNORMAL LOW (ref 4.22–5.81)
RDW: 16.5 % — ABNORMAL HIGH (ref 11.5–15.5)
WBC: 5.5 10*3/uL (ref 4.0–10.5)
nRBC: 0 % (ref 0.0–0.2)

## 2021-11-28 LAB — GLUCOSE, CAPILLARY
Glucose-Capillary: 199 mg/dL — ABNORMAL HIGH (ref 70–99)
Glucose-Capillary: 159 mg/dL — ABNORMAL HIGH (ref 70–99)
Glucose-Capillary: 126 mg/dL — ABNORMAL HIGH (ref 70–99)

## 2021-11-28 LAB — HEMOGLOBIN A1C
Hgb A1c MFr Bld: 6.1 % — ABNORMAL HIGH (ref 4.8–5.6)
Mean Plasma Glucose: 128.37 mg/dL

## 2021-11-28 MED ORDER — INSULIN ASPART 100 UNIT/ML IJ SOLN
0.0000 [IU] | Freq: Three times a day (TID) | INTRAMUSCULAR | Status: DC
  Administered 2021-11-28 (×2): 2 [IU] via SUBCUTANEOUS
  Administered 2021-11-29: 1 [IU] via SUBCUTANEOUS
  Administered 2021-11-29: 2 [IU] via SUBCUTANEOUS
  Administered 2021-11-30: 1 [IU] via SUBCUTANEOUS

## 2021-11-28 MED ORDER — INSULIN ASPART 100 UNIT/ML IJ SOLN: 0.0000 [IU] | Freq: Every day | INTRAMUSCULAR | Status: DC

## 2021-11-28 NOTE — Progress Notes (Signed)
PROGRESS NOTE  Christian Owens  DOB: 12/28/1960  PCP: Kathyrn Lass ZOX:[id number]  DOA: 11/25/2021  LOS: 3 days  Hospital Day: 4  Brief narrative: Christian Owens is a 61 y.o. male of [place] origin with PMH significant for GERD. Patient presented to the ED on 2/10 with complaint of nausea, vomiting, abdominal pain.  He has chronic mild abdominal discomfort due to reflux for which he is on a medication..  In the last few weeks, pain has progressively worsened and he has worsening oral intolerance.  Reports 30 pound of unintentional weight loss in the interval. EMS noted his blood pressure low at 70s and gave him 2 L normal saline with improvement in blood pressure to 96/50.  In the ED, patient was afebrile, heart rate in 80s, blood pressure in low 100s, breathing on room air. Initial lab with BUN/creatinine 24/1.95, AST/ALT slightly elevated to 75/52 with normal alk phos and bilirubin, INR 1.4. Lactic acid level significantly elevated to 8.5, WBC count elevated to 21.9, hemoglobin low at 7.7. CT abdomen showed dilated stomach suggestive of GOO or gastroparesis.  No evidence of dilated bowel loops. Admitted to hospitalist service GI was consulted.  Patient underwent emergent EGD. See below for details.  Subjective: Patient was seen and examined this morning.   Sitting up in chair.  Not in distress.  Tolerating liquid diet. Pending surgical biopsy report.  Assessment/Plan:  Assessment and Plan: * Gastric outlet obstruction- (present on admission) -Presented with history of GERD, 2 weeks of abdominal pain, nausea, vomiting, poor oral tolerance, weight loss.  CT findings as above showing possible GOO.  GI consult was obtained -Underwent EGD that showed findings as below LA Grade A reflux esophagitis with no bleeding. Large, friable, cratered gastric ulcer involving the incisura/antrum/prepylorus with contact oozing and copious clot with evidence of recent bleeding. Biopsied. Suspicious for  malignancy given size and friability Stenosis was found at the pylorus secondary to the ulcer. -Currently on Protonix 40 mg IV twice daily, Carafate suspension -General surgery consulted.  Waiting for pathology report. -Continue to monitor CBC.  Currently on full liquid diet.  ABLA (acute blood loss anemia) -Secondary to GI bleed.  Hemoglobin low at 6.2 the lowest.  1 unit of PRBC was transfused. Continue to monitor CBC.  Transfuse to keep more than 7.  Hemoglobin 8.1 today.  Recent Labs    11/25/21 1047 11/26/21 0100 11/26/21 0752 11/27/21 0245 11/28/21 0342  HGB 8.5* 6.2* 8.1* 7.2* 8.1*  MCV  --  85.3  --  83.3 83.5     SIRS (systemic inflammatory response syndrome) (HCC) Leukocytosis/lactic acidosis -On admission, patient has significant elevated WBC count and lactic acid level with no evidence of infection.  Likely due to dehydration, prolonged hypotension and AKI. -Reviewed labs with IV hydration.  WBC count improving, lactic acid down to normal.  Continue to monitor Recent Labs  Lab 11/25/21 1030 11/25/21 1144 11/25/21 1502 11/25/21 1945 11/26/21 0100 11/27/21 0245  WBC 21.9*  --   --   --  12.9* 11.2*  LATICACIDVEN 8.5* 5.3* 2.6* 2.8*  --  1.8     Hypotension -Patient was hypotensive on arrival due to dehydration.  With hydration, blood pressure improved.  He has a low BMI of 18.  Wonder if his blood pressure usually runs low. Currently on LR at 100 mL/h.  We will reduce IV fluid rate to 50 mill per hour  AKI (acute kidney injury) ([place]) -Creatinine elevated to 1.95 due to prolonged hypotension -Improved to normal  with IV hydration Recent Labs    11/25/21 1030 11/25/21 1047 11/26/21 0100 11/27/21 0245 11/28/21 0342  BUN 24* 27* 21 11 <5*  CREATININE 1.95* 1.90* 1.10 0.84 0.86     Elevated liver enzymes -AST/ ALT improving.  Low albumin probably because of poor nutrition.  Continue to monitor. Recent Labs  Lab 11/25/21 1030 11/26/21 0100  11/27/21 0245 11/28/21 0342  AST 75* 95* 47* 45*  ALT 52* 68* 54* 54*  ALKPHOS 63 54 45 54  BILITOT 0.7 0.6 0.5 0.3  PROT 5.4* 5.0* 4.4* 4.6*  ALBUMIN 2.4* 2.1* 2.0* 2.1*  INR 1.4*  --   --   --   LIPASE 30  --   --   --     Hypocalcemia- (present on admission) -Was low at 7.2 on admission.  Improved with IV calcium gluconate.  Albumin level low.  Corrected calcium level normal. Recent Labs  Lab 11/25/21 1030 11/26/21 0100 11/27/21 0245 11/28/21 0342  CALCIUM 7.2* 7.9* 7.7* 7.9*    BPH (benign prostatic hyperplasia) -CT abdomen on admission showed mildly large prostate and distended urinary bladder.  Once blood pressure improves, will start on Flomax.      Mobility: Encourage ambulation Goals of care   Code Status: Full Code    Nutritional status:  Body mass index is 18 kg/m.      Diet:  Diet Order             Diet full liquid Room service appropriate? Yes; Fluid consistency: Thin  Diet effective now                   DVT prophylaxis:  SCDs Start: 11/25/21 1406   Antimicrobials: None Fluid: LR at 50 ml/h Consultants: GI Family Communication: None at bedside  Status is: Inpatient  Continue in-hospital care because: Pending gastric biopsy report Level of care: Med-Surg   Dispo: The patient is from: Home              Anticipated d/c is to: Depends on clinical course              Patient currently is not medically stable to d/c.   Difficult to place patient No     Infusions:   lactated ringers 100 mL/hr at 11/28/21 1314    Scheduled Meds:  feeding supplement  1 Container Oral TID BM   insulin aspart  0-5 Units Subcutaneous QHS   insulin aspart  0-9 Units Subcutaneous TID WC   pantoprazole (PROTONIX) IV  40 mg Intravenous Q12H   sucralfate  1 g Oral Q6H    PRN meds: acetaminophen **OR** acetaminophen, hydrALAZINE, morphine injection, ondansetron **OR** ondansetron (ZOFRAN) IV, oxyCODONE   Antimicrobials: Anti-infectives (From  admission, onward)    Start     Dose/Rate Route Frequency Ordered Stop   11/27/21 0100  vancomycin (VANCOREADY) IVPB 750 mg/150 mL  Status:  Discontinued        750 mg 150 mL/hr over 60 Minutes Intravenous Every 36 hours 11/25/21 1138 11/25/21 1406   11/25/21 1145  vancomycin (VANCOCIN) IVPB 1000 mg/200 mL premix        1,000 mg 200 mL/hr over 60 Minutes Intravenous  Once 11/25/21 1137 11/25/21 1335   11/25/21 1145  ceFEPIme (MAXIPIME) 2 g in sodium chloride 0.9 % 100 mL IVPB  Status:  Discontinued        2 g 200 mL/hr over 30 Minutes Intravenous Every 24 hours 11/25/21 1137 11/25/21 1406   11/25/21 1130  metroNIDAZOLE (FLAGYL) IVPB 500 mg        500 mg 100 mL/hr over 60 Minutes Intravenous  Once 11/25/21 1125 11/25/21 1334       Objective: Vitals:   11/28/21 0539 11/28/21 0833  BP: (!) 107/93 112/73  Pulse: (!) 57 69  Resp: 17 19  Temp: 98 F (36.7 C) 98.4 F (36.9 C)  SpO2: 100% 100%    Intake/Output Summary (Last 24 hours) at 11/28/2021 1545 Last data filed at 11/28/2021 1400 Gross per 24 hour  Intake 2107.34 ml  Output 2 ml  Net 2105.34 ml   Filed Weights   11/25/21 1113 11/25/21 1509 11/25/21 1731  Weight: 48.8 kg 48.8 kg 43.2 kg   Weight change:  Body mass index is 18 kg/m.   Physical Exam: General exam: Pleasant middle-aged male.  Not in physical distress Skin: No rashes, lesions or ulcers. HEENT: Atraumatic, normocephalic, no obvious bleeding Lungs: Clear to auscultation bilaterally CVS: Regular rate and rhythm, no murmur GI/Abd soft, nondistended, mild epigastric tenderness, bowel sound present CNS: Alert, awake oriented x3 Psychiatry: Mood appropriate Extremities: No pedal edema, no calf tenderness.  Data Review: I have personally reviewed the laboratory data and studies available.  F/u labs ordered Unresulted Labs (From admission, onward)     Start     Ordered   11/27/21 0500  CBC with Differential/Platelet  Daily,   R      11/26/21 0843    11/27/21 0500  Comprehensive metabolic panel  Daily,   R      11/26/21 0843            Signed, Terrilee Croak, MD [facility] 11/28/2021

## 2021-11-28 NOTE — Progress Notes (Addendum)
°  Transition of Care [facility]) Screening Note   Patient Details  Name: Christian Owens Date of Birth: 1961-05-18   Transition of Care [facility]) CM/SW Contact:    Tom-Johnson, Hershal Coria, RN Phone Number: 11/28/2021, 1:37 PM  Patient is from home with girlfriend. Admitted for Gastric Outlet Obstruction. Patient gave CM permission to speak with his step grand daughter, who is his girlfriend's grand daughter as patient does not have children. CM called ans poke with [place] and she states that patient is currently employed by [employer] in [place]. Independent with care and drives self prior to admission. Does not have any DME's. Does not have a PCP. Los gave CM permission to call and schedule hospital follow with a clinic or PCP office that can accept him. Does not have a preference. CM called and scheduled post hospital followup with [facility] [phone]) as that's the only office that can schedule within a month's time. All other clinics and offices do not have any sate available till April. Information on AVS. No other needs or recommendations identified at this time. TOC will continue to monitor patient advancement through interdisciplinary progression rounds. If new patient transition needs arise, please place a TOC consult.

## 2021-11-28 NOTE — Progress Notes (Addendum)
° ° °   Attending physician's note   I have reviewed the chart, and discussed his care on rounds. I performed a substantive portion of this encounter, including complete performance of at least one of the key components, in conjunction with the APP. I agree with the APP's note, impression, and recommendations with my edits.  Pathology pending.  H/H otherwise stable after 1 unit PRBC transfusion earlier this admission.  Ashana Tullo, DO, FACG [phone] office          Progress Note   Subjective  Chief Complaint: Abdominal pain, nausea and vomiting EGD 2/10 with large gastric ulcer with bleeding and pyloric stenosis partial GOO  Today, I spoke with patient via his granddaughter who helped slightly with interpretation.  He tells me he continues with some abdominal pain but it seems to be decreased from previous.  He had a bowel movement just this morning that still look like "red/black jelly", but it was a very small amount.  He seemed to tolerate his diet okay this morning.   Objective   Vital signs in last 24 hours: Temp:  [98 F (36.7 C)-98.4 F (36.9 C)] 98.4 F (36.9 C) (02/13 0833) Pulse Rate:  [57-69] 69 (02/13 0833) Resp:  [17-19] 19 (02/13 0833) BP: (107-114)/(69-93) 112/73 (02/13 0833) SpO2:  [100 %] 100 % (02/13 0833) Last BM Date: 11/27/21 General:    Montagnard male in NAD Heart:  Regular rate and rhythm; no murmurs Lungs: Respirations even and unlabored, lungs CTA bilaterally Abdomen:  Soft, mild epigastric TTP and nondistended. Normal bowel sounds. Psych:  Cooperative. Normal mood and affect.  Intake/Output from previous day: 02/12 0701 - 02/13 0700 In: 3722.4 [P.O.:1800; I.V.:1922.4] Out: 2 [Urine:1; Stool:1] Intake/Output this shift: Total I/O In: 220 [P.O.:220] Out: -   Lab Results: Recent Labs    11/26/21 0100 11/26/21 0752 11/27/21 0245 11/28/21 0342  WBC 12.9*  --  11.2* 5.5  HGB 6.2* 8.1* 7.2* 8.1*  HCT 19.7* 24.9* 21.0* 23.7*  PLT 291   --  290 286   BMET Recent Labs    11/26/21 0100 11/27/21 0245 11/28/21 0342  NA 138 137 139  K 3.9 2.9* 3.6  CL 107 105 105  CO2 25 25 28   GLUCOSE 259* 193* 200*  BUN 21 11 <5*  CREATININE 1.10 0.84 0.86  CALCIUM 7.9* 7.7* 7.9*   LFT Recent Labs    11/28/21 0342  PROT 4.6*  ALBUMIN 2.1*  AST 45*  ALT 54*  ALKPHOS 54  BILITOT 0.3     Assessment / Plan:   Assessment: 1.  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  LA class a esophagitis 3.  Acute blood loss anemia: Stable today 4.  Hypokalemia: Resolved  Plan: 1.  Continue to monitor hemoglobin with transfusion as needed less than 7 2.  Continue Pantoprazole twice daily and Carafate p.o. 4 times daily 3.  Biopsies pending to rule out malignant ulcer 4.  If patient is truly tolerating full liquids could consider soft diet today.  Thank you for your kind consultation.  We will continue to follow.    LOS: 3 days   Levin Erp  11/28/2021, 12:26 PM

## 2021-11-29 LAB — COMPREHENSIVE METABOLIC PANEL
ALT: 52 U/L — ABNORMAL HIGH (ref 0–44)
AST: 41 U/L (ref 15–41)
Albumin: 2.2 g/dL — ABNORMAL LOW (ref 3.5–5.0)
Alkaline Phosphatase: 55 U/L (ref 38–126)
Anion gap: 8 (ref 5–15)
BUN: 6 mg/dL — ABNORMAL LOW (ref 8–23)
CO2: 25 mmol/L (ref 22–32)
Calcium: 8.3 mg/dL — ABNORMAL LOW (ref 8.9–10.3)
Chloride: 102 mmol/L (ref 98–111)
Creatinine, Ser: 0.89 mg/dL (ref 0.61–1.24)
GFR, Estimated: >60 mL/min (ref >60)
Glucose, Bld: 205 mg/dL — ABNORMAL HIGH (ref 70–99)
Potassium: 3.5 mmol/L (ref 3.5–5.1)
Sodium: 135 mmol/L (ref 135–145)
Total Bilirubin: 0.2 mg/dL — ABNORMAL LOW (ref 0.3–1.2)
Total Protein: 4.9 g/dL — ABNORMAL LOW (ref 6.5–8.1)

## 2021-11-29 LAB — CBC WITH DIFFERENTIAL/PLATELET
Abs Immature Granulocytes: 0.03 10*3/uL (ref 0.00–0.07)
Basophils Absolute: 0 10*3/uL (ref 0.0–0.1)
Basophils Relative: 1 %
Eosinophils Absolute: 0.3 10*3/uL (ref 0.0–0.5)
Eosinophils Relative: 6 %
HCT: 25.5 % — ABNORMAL LOW (ref 39.0–52.0)
Hemoglobin: 8.2 g/dL — ABNORMAL LOW (ref 13.0–17.0)
Immature Granulocytes: 1 %
Lymphocytes Relative: 26 %
Lymphs Abs: 1.5 10*3/uL (ref 0.7–4.0)
MCH: 27.5 pg (ref 26.0–34.0)
MCHC: 32.2 g/dL (ref 30.0–36.0)
MCV: 85.6 fL (ref 80.0–100.0)
Monocytes Absolute: 0.7 10*3/uL (ref 0.1–1.0)
Monocytes Relative: 13 %
Neutro Abs: 3.1 10*3/uL (ref 1.7–7.7)
Neutrophils Relative %: 53 %
Platelets: 290 10*3/uL (ref 150–400)
RBC: 2.98 MIL/uL — ABNORMAL LOW (ref 4.22–5.81)
RDW: 16.6 % — ABNORMAL HIGH (ref 11.5–15.5)
WBC: 5.7 10*3/uL (ref 4.0–10.5)
nRBC: 0 % (ref 0.0–0.2)

## 2021-11-29 LAB — GLUCOSE, CAPILLARY
Glucose-Capillary: 150 mg/dL — ABNORMAL HIGH (ref 70–99)
Glucose-Capillary: 161 mg/dL — ABNORMAL HIGH (ref 70–99)
Glucose-Capillary: 116 mg/dL — ABNORMAL HIGH (ref 70–99)
Glucose-Capillary: 169 mg/dL — ABNORMAL HIGH (ref 70–99)

## 2021-11-29 NOTE — Progress Notes (Addendum)
Attending physician's note   I have reviewed the chart and discussed his care on rounds today. I performed a substantive portion of this encounter, including complete performance of at least one of the key components, in conjunction with the APP. I agree with the APP's note, impression, and recommendations with my edits.   Pathology pending from EGD.  Otherwise no overt bleeding and H/H stable at 8.2/25.5.  BUN normal at 6 (from peak 27).  - Continue daily H/H checks while inpatient with PRBC transfusion per protocol - Continue high-dose PPI - Continue Carafate - GI service will follow peripherally, now just awaiting pathology results.  [address], DO, FACG [phone] office          Progress Note   Subjective  Chief Complaint: Abdominal pain, nausea and vomiting EGD 2/10 with large gastric ulcer and bleeding as well as pyloric stenosis and partial GOO  Today, patient tells me he continues to do well declines any abdominal pain.  Reports a bowel movement this morning that was "red/black jelly", very small amount again.  He only ended up having 1 stool yesterday.  Tolerating his diet fine.   Objective   Vital signs in last 24 hours: Temp:  [98.2 F (36.8 C)-98.5 F (36.9 C)] 98.2 F (36.8 C) (02/14 0800) Pulse Rate:  [65-73] 73 (02/14 0800) Resp:  [16-18] 16 (02/14 0457) BP: (103-125)/(63-72) 105/71 (02/14 0800) SpO2:  [100 %] 100 % (02/14 0800) Last BM Date : 11/27/21 General:    Montagnard male in NAD Heart:  Regular rate and rhythm; no murmurs Lungs: Respirations even and unlabored, lungs CTA bilaterally Abdomen:  Soft, nontender and nondistended. Normal bowel sounds. Psych:  Cooperative. Normal mood and affect.  Intake/Output from previous day: 02/13 0701 - 02/14 0700 In: 1453.6 [P.O.:860; I.V.:593.6] Out: 0  Intake/Output this shift: Total I/O In: 300 [P.O.:300] Out: -   Lab Results: Recent Labs    11/27/21 0245 11/28/21 0342 11/29/21 0339   WBC 11.2* 5.5 5.7  HGB 7.2* 8.1* 8.2*  HCT 21.0* 23.7* 25.5*  PLT 290 286 290   BMET Recent Labs    11/27/21 0245 11/28/21 0342 11/29/21 0339  NA 137 139 135  K 2.9* 3.6 3.5  CL 105 105 102  CO2 25 28 25   GLUCOSE 193* 200* 205*  BUN 11 <5* 6*  CREATININE 0.84 0.86 0.89  CALCIUM 7.7* 7.9* 8.3*   LFT Recent Labs    11/29/21 0339  PROT 4.9*  ALBUMIN 2.2*  AST 41  ALT 52*  ALKPHOS 55  BILITOT 0.2*     Assessment / Plan:   Assessment: 1.  Large gastric ulcer with bleeding and pyloric stenosis with partial G00: Seems to continue with a small amount of red jellylike material once daily, hemoglobin stable though, biopsies are still pending (called pathology who tells me they just received the specimen yesterday and the past should be completed by the end of the week) 2.  LA class A esophagitis 3.  Acute blood loss anemia: Stable  Plan: 1.  Continue to monitor hemoglobin and transfusing as needed less than 7 2.  Continue Pantoprazole twice daily and Carafate p.o. 4 times daily 3.  Biopsies are still pending to rule out malignant ulcer, per pathology should be completed by the end of the week  We may sign off and follow peripherally and give patient pathology when able, please call with any acute concerns.   LOS: 4 days   Levin Erp  11/29/2021, 10:56 AM

## 2021-11-29 NOTE — Progress Notes (Signed)
PROGRESS NOTE  Christian Owens  DOB: 24-May-1961  PCP: Kathyrn Lass ZOX:[id number]  DOA: 11/25/2021  LOS: 4 days  Hospital Day: 5  Brief narrative: Christian Owens is a 61 y.o. male of [place] origin with PMH significant for GERD. Patient presented to the ED on 2/10 with complaint of nausea, vomiting, abdominal pain.  He has chronic mild abdominal discomfort due to reflux for which he is on a medication..  In the last few weeks, pain has progressively worsened and he has worsening oral intolerance.  Reports 30 pound of unintentional weight loss in the interval. EMS noted his blood pressure low at 70s and gave him 2 L normal saline with improvement in blood pressure to 96/50.  In the ED, patient was afebrile, heart rate in 80s, blood pressure in low 100s, breathing on room air. Initial lab with BUN/creatinine 24/1.95, AST/ALT slightly elevated to 75/52 with normal alk phos and bilirubin, INR 1.4. Lactic acid level significantly elevated to 8.5, WBC count elevated to 21.9, hemoglobin low at 7.7. CT abdomen showed dilated stomach suggestive of GOO or gastroparesis.  No evidence of dilated bowel loops. Admitted to hospitalist service GI was consulted.  Patient underwent emergent EGD. See below for details.  Subjective: Patient was seen and examined this morning.  Pleasant middle-aged male.  Abdomen pain improving.  Tolerating full liquid diet.  Advance to soft diet today. Pending surgical biopsy report.  Per general surgery, reports expected to be back this afternoon.   Assessment and Plan: * Gastric outlet obstruction- (present on admission) -Presented with history of GERD, 2 weeks of abdominal pain, nausea, vomiting, poor oral tolerance, weight loss.  CT findings as above showing possible GOO.  GI consult was obtained -Underwent EGD that showed findings as below LA Grade A reflux esophagitis with no bleeding. Large, friable, cratered gastric ulcer involving the incisura/antrum/prepylorus with  contact oozing and copious clot with evidence of recent bleeding. Biopsied. Suspicious for malignancy given size and friability Stenosis was found at the pylorus secondary to the ulcer. -Currently on Protonix 40 mg IV twice daily, Carafate suspension -General surgery consult appreciated.  Waiting for pathology report.  Expected later today. -Continue to monitor CBC.  Currently on full liquid diet.  Advance to soft diet today  ABLA (acute blood loss anemia) -Secondary to GI bleed.  Hemoglobin low at 6.2 the lowest.  1 unit of PRBC was transfused. Continue to monitor CBC.  Transfuse to keep more than 7.  Hemoglobin 8.1 today.  Recent Labs    11/26/21 0100 11/26/21 0752 11/27/21 0245 11/28/21 0342 11/29/21 0339  HGB 6.2* 8.1* 7.2* 8.1* 8.2*  MCV 85.3  --  83.3 83.5 85.6     SIRS (systemic inflammatory response syndrome) (HCC) Leukocytosis/lactic acidosis -On admission, patient has significant elevated WBC count and lactic acid level with no evidence of infection.  Likely due to dehydration, prolonged hypotension and AKI. -Labs improved with IV hydration.  Hypotension -Patient was hypotensive on arrival due to dehydration.  With hydration, blood pressure improved.  He has a low BMI of 18.  Wonder if his blood pressure usually runs low.  Adequately hydrated.  We will stop IV fluid today.  AKI (acute kidney injury) ([place]) -Creatinine elevated to 1.95 due to prolonged hypotension -Improved to normal with IV hydration  Elevated liver enzymes -AST/ ALT improving.  Low albumin probably because of poor nutrition.  LFTs improving.  Hypocalcemia- (present on admission) -Was low at 7.2 on admission.  Improved with IV calcium gluconate.  Albumin  level low.  Corrected calcium level normal.   BPH (benign prostatic hyperplasia) -CT abdomen on admission showed mildly large prostate and distended urinary bladder.  Once blood pressure improves, will start on Flomax.    Mobility: Encourage  ambulation Goals of care   Code Status: Full Code    Nutritional status:  Body mass index is 18 kg/m.      Diet:  Diet Order             DIET SOFT Room service appropriate? Yes; Fluid consistency: Thin  Diet effective now                   DVT prophylaxis:  SCDs Start: 11/25/21 1406   Antimicrobials: None Fluid: Stop fluid today. Consultants: GI Family Communication: None at bedside  Status is: Inpatient  Continue in-hospital care because: Pending gastric biopsy report.  Diet advanced to soft today Level of care: Med-Surg   Dispo: The patient is from: Home              Anticipated d/c is to: Depends on clinical course              Patient currently is not medically stable to d/c.   Difficult to place patient No     Infusions:     Scheduled Meds:  feeding supplement  1 Container Oral TID BM   insulin aspart  0-5 Units Subcutaneous QHS   insulin aspart  0-9 Units Subcutaneous TID WC   pantoprazole (PROTONIX) IV  40 mg Intravenous Q12H   sucralfate  1 g Oral Q6H    PRN meds: acetaminophen **OR** acetaminophen, hydrALAZINE, morphine injection, ondansetron **OR** ondansetron (ZOFRAN) IV, oxyCODONE   Antimicrobials: Anti-infectives (From admission, onward)    Start     Dose/Rate Route Frequency Ordered Stop   11/27/21 0100  vancomycin (VANCOREADY) IVPB 750 mg/150 mL  Status:  Discontinued        750 mg 150 mL/hr over 60 Minutes Intravenous Every 36 hours 11/25/21 1138 11/25/21 1406   11/25/21 1145  vancomycin (VANCOCIN) IVPB 1000 mg/200 mL premix        1,000 mg 200 mL/hr over 60 Minutes Intravenous  Once 11/25/21 1137 11/25/21 1335   11/25/21 1145  ceFEPIme (MAXIPIME) 2 g in sodium chloride 0.9 % 100 mL IVPB  Status:  Discontinued        2 g 200 mL/hr over 30 Minutes Intravenous Every 24 hours 11/25/21 1137 11/25/21 1406   11/25/21 1130  metroNIDAZOLE (FLAGYL) IVPB 500 mg        500 mg 100 mL/hr over 60 Minutes Intravenous  Once 11/25/21 1125  11/25/21 1334       Objective: Vitals:   11/29/21 0457 11/29/21 0800  BP: 103/63 105/71  Pulse: 70 73  Resp: 16   Temp: 98.5 F (36.9 C) 98.2 F (36.8 C)  SpO2: 100% 100%    Intake/Output Summary (Last 24 hours) at 11/29/2021 1430 Last data filed at 11/29/2021 0900 Gross per 24 hour  Intake 1313.61 ml  Output 0 ml  Net 1313.61 ml   Filed Weights   11/25/21 1113 11/25/21 1509 11/25/21 1731  Weight: 48.8 kg 48.8 kg 43.2 kg   Weight change:  Body mass index is 18 kg/m.   Physical Exam: General exam: Pleasant middle-aged male.  Not in physical distress Skin: No rashes, lesions or ulcers. HEENT: Atraumatic, normocephalic, no obvious bleeding Lungs: Clear to auscultation bilaterally CVS: Regular rate and rhythm, no murmur GI/Abd soft, nondistended,  mild epigastric tenderness improving, bowel sound present CNS: Alert, awake oriented x3 Psychiatry: Mood appropriate Extremities: No pedal edema, no calf tenderness.  Data Review: I have personally reviewed the laboratory data and studies available.  F/u labs ordered Unresulted Labs (From admission, onward)     Start     Ordered   11/30/21 0500  Prealbumin  Tomorrow morning,   R       Question:  Specimen collection method  Answer:  Lab=Lab collect   11/29/21 1101            Signed, Terrilee Croak, MD [facility] 11/29/2021

## 2021-11-30 ENCOUNTER — Inpatient Hospital Stay (HOSPITAL_COMMUNITY): Payer: Commercial Managed Care - PPO

## 2021-11-30 DIAGNOSIS — K2970 Gastritis, unspecified, without bleeding: Secondary | ICD-10-CM

## 2021-11-30 DIAGNOSIS — B9681 Helicobacter pylori [H. pylori] as the cause of diseases classified elsewhere: Secondary | ICD-10-CM

## 2021-11-30 LAB — CULTURE, BLOOD (ROUTINE X 2)
Culture: NO GROWTH
Culture: NO GROWTH
Special Requests: ADEQUATE
Special Requests: ADEQUATE

## 2021-11-30 LAB — GLUCOSE, CAPILLARY
Glucose-Capillary: 140 mg/dL — ABNORMAL HIGH (ref 70–99)
Glucose-Capillary: 178 mg/dL — ABNORMAL HIGH (ref 70–99)

## 2021-11-30 LAB — PREALBUMIN: Prealbumin: 14.6 mg/dL — ABNORMAL LOW (ref 18–38)

## 2021-11-30 LAB — SURGICAL PATHOLOGY

## 2021-11-30 MED ORDER — BISMUTH SUBSALICYLATE 262 MG/15ML PO SUSP
30.0000 mL | Freq: Four times a day (QID) | ORAL | Status: DC
  Administered 2021-11-30 – 2021-12-01 (×3): 30 mL via ORAL
  Filled 2021-11-30: qty 236

## 2021-11-30 MED ORDER — METRONIDAZOLE 500 MG PO TABS
250.0000 mg | ORAL_TABLET | Freq: Four times a day (QID) | ORAL | Status: DC
  Administered 2021-11-30 – 2021-12-01 (×3): 250 mg via ORAL
  Filled 2021-11-30 (×2): qty 1
  Filled 2021-11-30: qty 0.5
  Filled 2021-11-30: qty 1

## 2021-11-30 MED ORDER — PANTOPRAZOLE SODIUM 40 MG PO TBEC: 40.0000 mg | DELAYED_RELEASE_TABLET | Freq: Two times a day (BID) | ORAL | Status: DC

## 2021-11-30 MED ORDER — PANTOPRAZOLE SODIUM 40 MG PO TBEC
40.0000 mg | DELAYED_RELEASE_TABLET | Freq: Two times a day (BID) | ORAL | Status: DC
  Administered 2021-11-30 – 2021-12-01 (×2): 40 mg via ORAL
  Filled 2021-11-30 (×2): qty 1

## 2021-11-30 MED ORDER — IOHEXOL 9 MG/ML PO SOLN
ORAL | Status: AC
  Filled 2021-11-30: qty 1000

## 2021-11-30 MED ORDER — DOXYCYCLINE HYCLATE 100 MG PO TABS
100.0000 mg | ORAL_TABLET | Freq: Two times a day (BID) | ORAL | Status: DC
  Administered 2021-11-30 – 2021-12-01 (×2): 100 mg via ORAL
  Filled 2021-11-30 (×2): qty 1

## 2021-11-30 MED ORDER — IOHEXOL 300 MG/ML  SOLN
95.0000 mL | Freq: Once | INTRAMUSCULAR | Status: AC | PRN
  Administered 2021-11-30: 95 mL via INTRAVENOUS

## 2021-11-30 MED ORDER — ENSURE MAX PROTEIN PO LIQD
11.0000 [oz_av] | Freq: Every day | ORAL | Status: DC
  Administered 2021-11-30 – 2021-12-01 (×2): 11 [oz_av] via ORAL
  Filled 2021-11-30 (×2): qty 330

## 2021-11-30 NOTE — Progress Notes (Signed)
PROGRESS NOTE  Augusta Hilbert  DOB: [birth year]  PCP: Kathyrn Lass MIW:[id number]  DOA: 11/25/2021  LOS: 5 days  Hospital Day: 6  Brief narrative: Christian Owens is a 61 y.o. male of [place] origin with PMH significant for GERD. Patient presented to the ED on 2/10 with complaint of nausea, vomiting, abdominal pain.  He has chronic mild abdominal discomfort due to reflux for which he is on a medication..  In the last few weeks, pain has progressively worsened and he has worsening oral intolerance.  Reports 30 pound of unintentional weight loss in the interval. EMS noted his blood pressure low at 70s and gave him 2 L normal saline with improvement in blood pressure to 96/50.  In the ED, patient was afebrile, heart rate in 80s, blood pressure in low 100s, breathing on room air. Initial lab with BUN/creatinine 24/1.95, AST/ALT slightly elevated to 75/52 with normal alk phos and bilirubin, INR 1.4. Lactic acid level significantly elevated to 8.5, WBC count elevated to 21.9, hemoglobin low at 7.7. CT abdomen showed dilated stomach suggestive of GOO or gastroparesis.  No evidence of dilated bowel loops. Admitted to hospitalist service GI was consulted.  Patient underwent emergent EGD. See below for details.  Subjective: Patient was seen and examined this afternoon.  Not in distress.  Patient is tolerating soft diet. Per general surgery team, patient's pathology report came back with no malignancy but some atypia.  CT scan of chest abdomen pelvis planned by general surgery.  Assessment and Plan: Gastric outlet obstruction- (present on admission) -Presented with history of GERD, 2 weeks of abdominal pain, nausea, vomiting, poor oral tolerance, weight loss.  CT findings as above showing possible GOO.  GI consult was obtained -Underwent EGD that showed findings as below LA Grade A reflux esophagitis with no bleeding. Large, friable, cratered gastric ulcer involving the incisura/antrum/prepylorus with  contact oozing and copious clot with evidence of recent bleeding. Biopsied. Suspicious for malignancy given size and friability Stenosis was found at the pylorus secondary to the ulcer. -Currently on Protonix 40 mg IV twice daily, Carafate suspension -General surgery consult appreciated.  Pathology back this afternoon with no malignancy but some atypia.  CT scan of chest abdomen pelvis ordered by general surgery. -Continue to monitor CBC.  Currently on soft diet.  ABLA (acute blood loss anemia) -Secondary to GI bleed.  Hemoglobin low at 6.2 the lowest.  1 unit of PRBC was transfused. Continue to monitor CBC.  Transfuse to keep more than 7.  Hemoglobin was 8.2 on last check on 2/14. Recent Labs    11/25/21 1030 11/25/21 1047 11/26/21 0100 11/26/21 0752 11/27/21 0245 11/28/21 0342 11/29/21 0339  HGB 7.7* 8.5* 6.2* 8.1* 7.2* 8.1* 8.2*   SIRS (systemic inflammatory response syndrome) (HCC) Leukocytosis/lactic acidosis -On admission, patient has significant elevated WBC count and lactic acid level with no evidence of infection.  Likely due to dehydration, prolonged hypotension and AKI. -Labs improved with IV hydration.  Hyperglycemia/prediabetes -A1c 6.1 -Noted to check fingersticks.  Hypotension -Patient was hypotensive on arrival due to dehydration.  With hydration, blood pressure improved.  He has a low BMI of 18.  Wonder if his blood pressure usually runs low.  Currently low 100s.  AKI (acute kidney injury) ([place]) -Creatinine elevated to 1.95 due to prolonged hypotension -Improved to normal with IV hydration  Elevated liver enzymes -AST/ ALT improving.  Low albumin probably because of poor nutrition.  LFTs improving.  Hypocalcemia- (present on admission) -Was low at 7.2 on admission.  Improved with IV calcium gluconate.  Albumin level low.  Corrected calcium level normal.  BPH (benign prostatic hyperplasia) -CT abdomen on admission showed mildly large prostate and distended  urinary bladder.  Once blood pressure improves, can start on Flomax.  Mobility: Encourage ambulation Goals of care   Code Status: Full Code    Nutritional status:  Body mass index is 18 kg/m.      Diet:  Diet Order             Diet Carb Modified Fluid consistency: Thin; Room service appropriate? Yes  Diet effective now                   DVT prophylaxis:  SCDs Start: 11/25/21 1406   Antimicrobials: None Fluid: Stop fluid today. Consultants: GI Family Communication: None at bedside  Status is: Inpatient  Continue in-hospital care because: Pending CT chest, abdomen pelvis.  Diet advanced to soft today Level of care: Med-Surg   Dispo: The patient is from: Home              Anticipated d/c is to: Depends on clinical course              Patient currently is not medically stable to d/c.   Difficult to place patient No     Infusions:     Scheduled Meds:  feeding supplement  1 Container Oral TID BM   pantoprazole (PROTONIX) IV  40 mg Intravenous Q12H   sucralfate  1 g Oral Q6H    PRN meds: acetaminophen **OR** acetaminophen, hydrALAZINE, morphine injection, ondansetron **OR** ondansetron (ZOFRAN) IV, oxyCODONE   Antimicrobials: Anti-infectives (From admission, onward)    Start     Dose/Rate Route Frequency Ordered Stop   11/27/21 0100  vancomycin (VANCOREADY) IVPB 750 mg/150 mL  Status:  Discontinued        750 mg 150 mL/hr over 60 Minutes Intravenous Every 36 hours 11/25/21 1138 11/25/21 1406   11/25/21 1145  vancomycin (VANCOCIN) IVPB 1000 mg/200 mL premix        1,000 mg 200 mL/hr over 60 Minutes Intravenous  Once 11/25/21 1137 11/25/21 1335   11/25/21 1145  ceFEPIme (MAXIPIME) 2 g in sodium chloride 0.9 % 100 mL IVPB  Status:  Discontinued        2 g 200 mL/hr over 30 Minutes Intravenous Every 24 hours 11/25/21 1137 11/25/21 1406   11/25/21 1130  metroNIDAZOLE (FLAGYL) IVPB 500 mg        500 mg 100 mL/hr over 60 Minutes Intravenous  Once 11/25/21  1125 11/25/21 1334       Objective: Vitals:   11/30/21 0601 11/30/21 0933  BP: 100/66 101/64  Pulse: 69 75  Resp: 17 18  Temp: 97.9 F (36.6 C) 97.8 F (36.6 C)  SpO2: 100% 100%    Intake/Output Summary (Last 24 hours) at 11/30/2021 1454 Last data filed at 11/30/2021 1423 Gross per 24 hour  Intake 1380 ml  Output --  Net 1380 ml   Filed Weights   11/25/21 1113 11/25/21 1509 11/25/21 1731  Weight: 48.8 kg 48.8 kg 43.2 kg   Weight change:  Body mass index is 18 kg/m.   Physical Exam: General exam: Pleasant middle-aged male.  Not in physical distress Skin: No rashes, lesions or ulcers. HEENT: Atraumatic, normocephalic, no obvious bleeding Lungs: Clear to auscultation bilaterally CVS: Regular rate and rhythm, no murmur GI/Abd soft, nondistended, nontender, bowel sound present CNS: Alert, awake oriented x3 Psychiatry: Mood appropriate Extremities: No  pedal edema, no calf tenderness.  Data Review: I have personally reviewed the laboratory data and studies available.  F/u labs ordered Unresulted Labs (From admission, onward)    None       Signed, Terrilee Croak, MD [facility] 11/30/2021

## 2021-11-30 NOTE — Progress Notes (Addendum)
Attending physician's note   I have reviewed the chart and discussed his care on rounds. I performed a substantive portion of this encounter, including complete performance of at least one of the key components, in conjunction with the APP. I agree with the APP's note, impression, and recommendations with my edits.   Plan for quadruple therapy as outlined with repeat EGD in 6 weeks to assess healing and H. pylori eradication.  Arranging for outpatient GI follow-up  Inpatient GI service will sign off at this time.  Please do not hesitate to contact with additional questions or concerns  Doristine Locks, DO, FACG [phone] office                Daily Rounding Note  11/30/2021, 3:12 PM  LOS: 5 days   SUBJECTIVE:   Chief complaint:  partial GOO.  Gastric ulcer    Pt tolerating soft diet.  Continues on Carafate slurry   Pathology back:  A.   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  Immunohistochemistry with appropriate controls for single antibody for  pancytokeratin does not highlight any malignancy, for p53 is wild-type  and for H. pylori highlights H. pylori-like organisms.  No definite dysplasia or malignancy is seen.   B. STOMACH, BIOPSY:  Moderate chronic active gastritis with reactive atypia.  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  Immunohistochemistry with appropriate controls for p53 is wild type and  for H. pylori is positive for numerous H pylori-like organisms.  No definite dysplasia or malignancy is seen.   OBJECTIVE:         Vital signs in last 24 hours:    Temp:  [97.8 F (36.6 C)-98.4 F (36.9 C)] 97.8 F (36.6 C) (02/15 0933) Pulse Rate:  [65-75] 75 (02/15 0933) Resp:  [17-18] 18 (02/15 0933) BP: (100-115)/(60-66) 101/64  (02/15 0933) SpO2:  [100 %] 100 % (02/15 0933) Last BM Date : 11/27/21 Filed Weights   11/25/21 1113 11/25/21 1509 11/25/21 1731  Weight: 48.8 kg 48.8 kg 43.2 kg   Not re-examined  Intake/Output from previous day: 02/14 0701 - 02/15 0700 In: 1200 [P.O.:1200] Out: -   Intake/Output this shift: Total I/O In: 720 [P.O.:720] Out: -   Lab Results: Recent Labs    11/28/21 0342 11/29/21 0339  WBC 5.5 5.7  HGB 8.1* 8.2*  HCT 23.7* 25.5*  PLT 286 290   BMET Recent Labs    11/28/21 0342 11/29/21 0339  NA 139 135  K 3.6 3.5  CL 105 102  CO2 28 25  GLUCOSE 200* 205*  BUN <5* 6*  CREATININE 0.86 0.89  CALCIUM 7.9* 8.3*   LFT Recent Labs    11/28/21 0342 11/29/21 0339  PROT 4.6* 4.9*  ALBUMIN 2.1* 2.2*  AST 45* 41  ALT 54* 52*  ALKPHOS 54 55  BILITOT 0.3 0.2*   PT/INR No results for input(s): LABPROT, INR in the last 72 hours. Hepatitis Panel No results for input(s): HEPBSAG, HCVAB, HEPAIGM, HEPBIGM in the last 72 hours.  Studies/Results: No results found.  Scheduled Meds:  feeding supplement  1 Container Oral TID BM   pantoprazole (PROTONIX) IV  40 mg Intravenous Q12H   sucralfate  1 g Oral Q6H   Continuous Infusions: PRN Meds:.acetaminophen **OR** acetaminophen, hydrALAZINE, morphine injection, ondansetron **OR** ondansetron (ZOFRAN) IV, oxyCODONE   ASSESMENT:     Large gastric  ulcer with resolving GOO.   Reflux esophagitis.  Atypia but no malignancy on pathology.       [place] anemia. ABL anemia. Hgb 6.2 .. 8.2.  s/p 1 PRBC on 2/11.     PLAN     Switch to Protonix 40 mg po bid.  Stay on this dose until follows up w GI.      H pylori mgt w : Protonix 40 mg po bid.  Bismuth subsalicylate 737 mg po qid.  Metronidazole 250 mg qid.  Doxycyline 100 mg po bid. I ordered regimen to start now for 14 days total.    Needs EGD in ~ 6 weeks to assess healing.   Will arrange GI OV next month.      Azucena Freed  11/30/2021, 3:12 PM Phone [phone]

## 2021-11-30 NOTE — Progress Notes (Signed)
5 Days Post-Op  Subjective: CC: Pathology back from bx on 2/10 A.   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  Immunohistochemistry with appropriate controls for single antibody for  pancytokeratin does not highlight any malignancy, for p53 is wild-type  and for H. pylori highlights H. pylori-like organisms.  No definite dysplasia or malignancy is seen.   B. STOMACH, BIOPSY:  Moderate chronic active gastritis with reactive atypia.  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  Immunohistochemistry with appropriate controls for p53 is wild type and  for H. pylori is positive for numerous H pylori-like organisms.  No definite dysplasia or malignancy is seen.  I was able to see patient with family (son in law) present who helped interpret. Patient reports he has no abdominal pain, n/v. He is finishing ~50% of his trays or more + 1 protein shake per day. He had spaghetti for lunch today. Passing flatus. Last BM 2 days ago.   Objective: Vital signs in last 24 hours: Temp:  [97.8 F (36.6 C)-98.4 F (36.9 C)] 97.8 F (36.6 C) (02/15 0933) Pulse Rate:  [65-75] 75 (02/15 0933) Resp:  [17-18] 18 (02/15 0933) BP: (100-115)/(60-66) 101/64 (02/15 0933) SpO2:  [100 %] 100 % (02/15 0933) Last BM Date : 11/27/21  Intake/Output from previous day: 02/14 0701 - 02/15 0700 In: 1200 [P.O.:1200] Out: -  Intake/Output this shift: Total I/O In: 720 [P.O.:720] Out: -   PE: Gen:  Alert, NAD, pleasant Abd: Soft, ND, NT +BS Psych: A&Ox3  Skin: no rashes noted, warm and dry  Lab Results:  Recent Labs    11/28/21 0342 11/29/21 0339  WBC 5.5 5.7  HGB 8.1* 8.2*  HCT 23.7* 25.5*  PLT 286 290   BMET Recent Labs    11/28/21 0342 11/29/21 0339  NA 139 135  K 3.6 3.5  CL 105 102  CO2 28 25  GLUCOSE 200* 205*  BUN  <5* 6*  CREATININE 0.86 0.89  CALCIUM 7.9* 8.3*   PT/INR No results for input(s): LABPROT, INR in the last 72 hours. CMP     Component Value Date/Time   NA 135 11/29/2021 0339   K 3.5 11/29/2021 0339   CL 102 11/29/2021 0339   CO2 25 11/29/2021 0339   GLUCOSE 205 (H) 11/29/2021 0339   BUN 6 (L) 11/29/2021 0339   CREATININE 0.89 11/29/2021 0339   CALCIUM 8.3 (L) 11/29/2021 0339   PROT 4.9 (L) 11/29/2021 0339   ALBUMIN 2.2 (L) 11/29/2021 0339   AST 41 11/29/2021 0339   ALT 52 (H) 11/29/2021 0339   ALKPHOS 55 11/29/2021 0339   BILITOT 0.2 (L) 11/29/2021 0339   GFRNONAA >60 11/29/2021 0339   Lipase     Component Value Date/Time   LIPASE 30 11/25/2021 1030    Studies/Results: No results found.  Anti-infectives: Anti-infectives (From admission, onward)    Start     Dose/Rate Route Frequency Ordered Stop   11/30/21 2200  doxycycline (VIBRA-TABS) tablet 100 mg        100 mg Oral 2 times daily 11/30/21 1526 12/14/21 2159   11/30/21 1800  metroNIDAZOLE (FLAGYL) tablet 250 mg        250 mg Oral 4 times daily 11/30/21 1526 12/14/21 1759   11/27/21 0100  vancomycin (VANCOREADY) IVPB 750 mg/150 mL  Status:  Discontinued        750  mg 150 mL/hr over 60 Minutes Intravenous Every 36 hours 11/25/21 1138 11/25/21 1406   11/25/21 1145  vancomycin (VANCOCIN) IVPB 1000 mg/200 mL premix        1,000 mg 200 mL/hr over 60 Minutes Intravenous  Once 11/25/21 1137 11/25/21 1335   11/25/21 1145  ceFEPIme (MAXIPIME) 2 g in sodium chloride 0.9 % 100 mL IVPB  Status:  Discontinued        2 g 200 mL/hr over 30 Minutes Intravenous Every 24 hours 11/25/21 1137 11/25/21 1406   11/25/21 1130  metroNIDAZOLE (FLAGYL) IVPB 500 mg        500 mg 100 mL/hr over 60 Minutes Intravenous  Once 11/25/21 1125 11/25/21 1334        Assessment/Plan Large Gastric Ulcer w/ Partial GOO  - S/p upper endoscopy and biopsy 2/10 Dr. Candis Schatz. This showed ulcerated area just proximal to pylorus. Pylorus was  narrowed but ultimately able to be transversed.  - Path w/ no obvious malignancy but did show some atypia - H. Pylori positive - on quadruple therapy per GI. They are planning repeat EGD in ~6 weeks to assess healing. Recommend avoiding NSAIDs - Discussed with Dr. Redmond Pulling. With path showing atypia, will plan to get CT CAP w/ IV contrast. Will have patient follow up with Dr. Zenia Resides in the office to discuss if surgical intervention is warranted.  - Encourage liquid/soft diet and protein shakes for nutrition - We will follow peripherally at this time but are available with any questions, concerns, or changes.   FEN - Soft, boost + ensure max  VTE - SCDs, okay for chemical prophylaxis from a general surgery standpoint ID - Quadruple therapy for H. Pylori  This care required moderate level of medical decision making.    LOS: 5 days    Jillyn Ledger , [facility] 11/30/2021, 4:12 PM Please see Amion for pager number during day hours 7:00am-4:30pm

## 2021-11-30 NOTE — Plan of Care (Signed)
  Problem: Clinical Measurements: Goal: Diagnostic test results will improve Outcome: Progressing   

## 2021-12-01 ENCOUNTER — Other Ambulatory Visit (HOSPITAL_COMMUNITY): Payer: Self-pay

## 2021-12-01 ENCOUNTER — Encounter: Payer: Self-pay | Admitting: Gastroenterology

## 2021-12-01 MED ORDER — BISMUTH SUBSALICYLATE 262 MG PO CHEW
524.0000 mg | CHEWABLE_TABLET | Freq: Four times a day (QID) | ORAL | 0 refills | Status: DC
  Filled 2021-12-01: qty 100, 13d supply, fill #0

## 2021-12-01 MED ORDER — SUCRALFATE 1 GM/10ML PO SUSP: 1.0000 g | Freq: Four times a day (QID) | ORAL | 0 refills | Status: DC

## 2021-12-01 MED ORDER — METRONIDAZOLE 250 MG PO TABS
250.0000 mg | ORAL_TABLET | Freq: Four times a day (QID) | ORAL | 0 refills | Status: DC
  Filled 2021-12-01: qty 56, 14d supply, fill #0

## 2021-12-01 MED ORDER — TAMSULOSIN HCL 0.4 MG PO CAPS: 0.4000 mg | ORAL_CAPSULE | Freq: Every day | ORAL | 2 refills | Status: AC

## 2021-12-01 MED ORDER — DOXYCYCLINE HYCLATE 100 MG PO TABS
100.0000 mg | ORAL_TABLET | Freq: Two times a day (BID) | ORAL | 0 refills | Status: DC
  Filled 2021-12-01: qty 28, 14d supply, fill #0

## 2021-12-01 MED ORDER — METRONIDAZOLE 250 MG PO TABS: 250.0000 mg | ORAL_TABLET | Freq: Four times a day (QID) | ORAL | 0 refills | Status: AC

## 2021-12-01 MED ORDER — PANTOPRAZOLE SODIUM 40 MG PO TBEC: 40.0000 mg | DELAYED_RELEASE_TABLET | Freq: Two times a day (BID) | ORAL | 2 refills | Status: DC

## 2021-12-01 MED ORDER — PANTOPRAZOLE SODIUM 40 MG PO TBEC
40.0000 mg | DELAYED_RELEASE_TABLET | Freq: Two times a day (BID) | ORAL | 2 refills | Status: DC
  Filled 2021-12-01: qty 60, 30d supply, fill #0

## 2021-12-01 MED ORDER — DOXYCYCLINE HYCLATE 100 MG PO TABS: 100.0000 mg | ORAL_TABLET | Freq: Two times a day (BID) | ORAL | 0 refills | Status: AC

## 2021-12-01 MED ORDER — BISMUTH SUBSALICYLATE 262 MG PO CHEW: 524.0000 mg | CHEWABLE_TABLET | Freq: Four times a day (QID) | ORAL | 0 refills | Status: AC

## 2021-12-01 MED ORDER — TAMSULOSIN HCL 0.4 MG PO CAPS
0.4000 mg | ORAL_CAPSULE | Freq: Every day | ORAL | 2 refills | Status: DC
  Filled 2021-12-01: qty 30, 30d supply, fill #0

## 2021-12-01 MED ORDER — SUCRALFATE 1 GM/10ML PO SUSP
1.0000 g | Freq: Four times a day (QID) | ORAL | 0 refills | Status: DC
  Filled 2021-12-01: qty 560, 14d supply, fill #0

## 2021-12-01 NOTE — Plan of Care (Signed)
°  Problem: Education: Goal: Knowledge of General Education information will improve Description: Including pain rating scale, medication(s)/side effects and non-pharmacologic comfort measures Outcome: Adequate for Discharge   Problem: Clinical Measurements: Goal: Ability to maintain clinical measurements within normal limits will improve Outcome: Adequate for Discharge Goal: Will remain free from infection Outcome: Adequate for Discharge Goal: Diagnostic test results will improve Outcome: Adequate for Discharge Goal: Respiratory complications will improve Outcome: Adequate for Discharge Goal: Cardiovascular complication will be avoided Outcome: Adequate for Discharge   Problem: Activity: Goal: Risk for activity intolerance will decrease Outcome: Adequate for Discharge   Problem: Nutrition: Goal: Adequate nutrition will be maintained Outcome: Adequate for Discharge   Problem: Coping: Goal: Level of anxiety will decrease Outcome: Adequate for Discharge

## 2021-12-01 NOTE — TOC Transition Note (Signed)
Transition of Care [facility]) - CM/SW Discharge Note   Patient Details  Name: Christian Owens MRN: [id number] Date of Birth: [birth year]  Transition of Care [facility]) CM/SW Contact:  Tom-Johnson, Hershal Coria, RN Phone Number: 12/01/2021, 3:18 PM   Clinical Narrative:     Patient is scheduled for discharge today. No recommendations from PT/OT. Outpatient f/u with [facility] and info on AVS. Denies any needs. Family to transport at discharge. No further TOC needs noted.   Final next level of care: Home/Self Care Barriers to Discharge: Barriers Resolved   Patient Goals and CMS Choice Patient states their goals for this hospitalization and ongoing recovery are:: To return home CMS Medicare.gov Compare Post Acute Care list provided to:: Patient Choice offered to / list presented to : NA  Discharge Placement                       Discharge Plan and Services                DME Arranged: N/A DME Agency: NA       HH Arranged: NA HH Agency: NA        Social Determinants of Health (SDOH) Interventions     Readmission Risk Interventions No flowsheet data found.

## 2021-12-01 NOTE — Progress Notes (Signed)
CT reviewed.  No evidence of malignancy noted on CT scan.  Continue currently plan of care with treatment for H pylori and ulcer disease.  Will have him follow up with a foregut surgeon as an outpatient as well as GI follow up for repeat EGD in 6 weeks to reassess healing.  We will sign off at this time.  Letha Cape 9:35 AM 12/01/2021

## 2021-12-01 NOTE — Discharge Summary (Signed)
Physician Discharge Summary  Christian Owens [id number]:[id number] DOB: [birth year] DOA: 11/25/2021  PCP: Pcp, No  Admit date: 11/25/2021 Discharge date: 12/01/2021  Admitted From: Home Discharge disposition: Home  Recommendations at discharge:  You have been started on quadruple therapy with Protonix 40 mg twice daily, bismuth subsalicylate 300 mg 4 times daily, Flagyl 250 mg 4 times daily, doxycycline 100 mg twice daily for 14 days. Continue Protonix 40 mg twice daily till next follow-up in 6 weeks. Stay on soft diet.  Advance slowly to regular diet as tolerated. You have been started on Flomax 0.4 mg nightly for enlarged prostate.  Brief narrative: Christian Owens is a 61 y.o. male of [place] with PMH significant for GERD. Patient presented to the ED on 2/10 with complaint of nausea, vomiting, abdominal pain.  He has chronic mild abdominal discomfort due to reflux for which he is on a medication..  In the last few weeks, pain has progressively worsened and he has worsening oral intolerance.  Reports 30 pound of unintentional weight loss in the interval. EMS noted his blood pressure low at 70s and gave him 2 L normal saline with improvement in blood pressure to 96/50.  In the ED, patient was afebrile, heart rate in 80s, blood pressure in low 100s, breathing on room air. Initial lab with BUN/creatinine 24/1.95, AST/ALT slightly elevated to 75/52 with normal alk phos and bilirubin, INR 1.4. Lactic acid level significantly elevated to 8.5, WBC count elevated to 21.9, hemoglobin low at 7.7. CT abdomen showed dilated stomach suggestive of GOO or gastroparesis.  No evidence of dilated bowel loops. Admitted to hospitalist service GI was consulted.  Patient underwent emergent EGD. See below for details.  Subjective: Patient was seen and examined this afternoon.  Not in distress.  Patient is tolerating soft diet. Per general surgery team, patient's pathology report came back with no malignancy but  some atypia.  CT scan of chest abdomen pelvis planned by general surgery.  Assessment and Plan: Gastric outlet obstruction- (present on admission) -Presented with history of GERD, 2 weeks of abdominal pain, nausea, vomiting, poor oral tolerance, weight loss.  CT findings as above showing possible GOO.  GI consult was obtained -Underwent EGD that showed findings as below LA Grade A reflux esophagitis with no bleeding. Large, friable, cratered gastric ulcer involving the incisura/antrum/prepylorus with contact oozing and copious clot with evidence of recent bleeding. Biopsied.  Stenosis was found at the pylorus secondary to the ulcer.  H. pylori gastritis with reactive atypia, intestinal metaplasia -Seen in biopsy obtained from the stomach.  No evidence of malignancy but positive for H. Pylori -Started on quadruple therapy with Protonix 40 mg twice daily, bismuth subsalicylate 300 mg 4 times daily, Flagyl 250 mg 4 times daily, doxycycline 100 mg twice daily for 14 days. -Patient will stay on Protonix 40 mg twice daily till next follow-up. -Patient will follow up with GI as an outpatient in 6 weeks for repeat EGD. -Because of the presence of reactive atypia, CT scan of chest abdomen pelvis was obtained.  No evidence of malignancy noted. -Currently patient is able to tolerate soft diet.  Discharge home to continue soft diet.    ABLA (acute blood loss anemia) -Secondary to GI bleed.  Hemoglobin low at 6.2 the lowest.  1 unit of PRBC was transfused.  Hemoglobin has been stable over 8 for last few days.  No recurrence of bleeding. Recent Labs    11/25/21 1030 11/25/21 1047 11/26/21 0100 11/26/21 0752 11/27/21 0245 11/28/21  3235 11/29/21 0339  HGB 7.7* 8.5* 6.2* 8.1* 7.2* 8.1* 8.2*   SIRS (systemic inflammatory response syndrome) (HCC) Leukocytosis/lactic acidosis -On admission, patient has significant elevated WBC count and lactic acid level with no evidence of infection.  Likely due to  dehydration, prolonged hypotension and AKI. -WBC and lactic acid level improved on subsequent labs.  Hyperglycemia/prediabetes -A1c 6.1 -Noted to check fingersticks.  Hypotension -Patient was hypotensive on arrival due to dehydration.  With hydration, blood pressure improved.  He has a low BMI of 18.  Wonder if his blood pressure usually runs low.  Currently low 100s.  AKI (acute kidney injury) ([place]) -Creatinine elevated to 1.95 due to prolonged hypotension -Improved to normal with IV hydration  Elevated liver enzymes -AST/ ALT improving.  Low albumin probably because of poor nutrition.  LFTs improving.  Hypocalcemia- (present on admission) -Was low at 7.2 on admission.  Improved with IV calcium gluconate.  Albumin level low.  Corrected calcium level normal.  BPH (benign prostatic hyperplasia) -CT abdomen on admission showed mildly large prostate and distended urinary bladder.  Blood pressure has improved.  We will discharge him on Flomax 0.4 mg nightly.    Discharge Exam:   Vitals:   11/30/21 1705 11/30/21 2138 12/01/21 0504 12/01/21 0845  BP: 108/65 106/78 99/61 103/63  Pulse: 74 70 73 82  Resp: $Remo'18 17 18 17  'QYvVg$ Temp: 98.6 F (37 C) 98.4 F (36.9 C) 98.3 F (36.8 C) 98.6 F (37 C)  TempSrc: Oral Oral Oral Oral  SpO2: 100% 100% 100% 100%  Weight:      Height:        Body mass index is 18 kg/m.  General exam: Pleasant middle-aged male.  Not in physical distress Skin: No rashes, lesions or ulcers. HEENT: Atraumatic, normocephalic, no obvious bleeding Lungs: Clear to auscultation bilaterally CVS: Regular rate and rhythm, no murmur GI/Abd soft, nondistended, nontender, bowel sound present CNS: Alert, awake oriented x3 Psychiatry: Mood appropriate Extremities: No pedal edema, no calf tenderness.  Follow ups:    Follow-up Information     Levin Erp, PA Follow up on 12/20/2021.   Specialty: Gastroenterology Why: 9:30 AM follow up with gastro provider.  You  will see PA that day.  Your GI MD is Dr Dustin Flock MD. Contact information: [place] [address] [phone]         Greer Pickerel, MD Follow up on 12/21/2021.   Specialty: General Surgery Why: 9:00am, arrive by 8:30am.  Please bring a family member to translate for you for this appointment. Contact information: [place] [address] [phone]                 Discharge Instructions:   Discharge Instructions     Call MD for:  difficulty breathing, headache or visual disturbances   Complete by: As directed    Call MD for:  extreme fatigue   Complete by: As directed    Call MD for:  hives   Complete by: As directed    Call MD for:  persistant dizziness or light-headedness   Complete by: As directed    Call MD for:  persistant nausea and vomiting   Complete by: As directed    Call MD for:  severe uncontrolled pain   Complete by: As directed    Call MD for:  temperature >100.4   Complete by: As directed    Diet general   Complete by: As  directed    Discharge instructions   Complete by: As directed     You have been started on quadruple therapy with Protonix 40 mg twice daily, bismuth subsalicylate 654 mg 4 times daily, Flagyl 250 mg 4 times daily, doxycycline 100 mg twice daily for 14 days.  Continue Protonix 40 mg twice daily till next follow-up in 6 weeks.  Stay on soft diet.  Advance slowly to regular diet as tolerated.  You have been started on Flomax 0.4 mg nightly for enlarged prostate.  General discharge instructions: Follow with Primary MD Pcp, No in 7 days  Please request your PCP  to go over your hospital tests, procedures, radiology results at the follow up. Please get your medicines reviewed and adjusted.  Your PCP may decide to repeat certain labs or tests as needed. Do not drive, operate heavy machinery, perform activities at heights, swimming or participation in water activities or provide  baby sitting services if your were admitted for syncope or siezures until you have seen by Primary MD or a Neurologist and advised to do so again. [place] Controlled Substance Reporting System database was reviewed. Do not drive, operate heavy machinery, perform activities at heights, swim, participate in water activities or provide baby-sitting services while on medications for pain, sleep and mood until your outpatient physician has reevaluated you and advised to do so again.  You are strongly recommended to comply with the dose, frequency and duration of prescribed medications. Activity: As tolerated with Full fall precautions use walker/cane & assistance as needed Avoid using any recreational substances like cigarette, tobacco, alcohol, or non-prescribed drug. If you experience worsening of your admission symptoms, develop shortness of breath, life threatening emergency, suicidal or homicidal thoughts you must seek medical attention immediately by calling 911 or calling your MD immediately  if symptoms less severe. You must read complete instructions/literature along with all the possible adverse reactions/side effects for all the medicines you take and that have been prescribed to you. Take any new medicine only after you have completely understood and accepted all the possible adverse reactions/side effects.  Wear Seat belts while driving. You were cared for by a hospitalist during your hospital stay. If you have any questions about your discharge medications or the care you received while you were in the hospital after you are discharged, you can call the unit and ask to speak with the hospitalist or the covering physician. Once you are discharged, your primary care physician will handle any further medical issues. Please note that NO REFILLS for any discharge medications will be authorized once you are discharged, as it is imperative that you return to your primary care physician (or establish  a relationship with a primary care physician if you do not have one).   Increase activity slowly   Complete by: As directed        Discharge Medications:   Allergies as of 12/01/2021   No Known Allergies      Medication List     STOP taking these medications    esomeprazole 20 MG capsule Commonly known as: NEXIUM   famotidine 20 MG tablet Commonly known as: PEPCID   fexofenadine 180 MG tablet Commonly known as: ALLEGRA   OVER THE COUNTER MEDICATION   Zantac 360 [address] 20 MG tablet Generic drug: famotidine       TAKE these medications    acetaminophen 500 MG tablet Commonly known as: TYLENOL Take 500 mg by mouth every 6 (six) hours as needed  for headache.   bismuth subsalicylate 594 VO/59YT suspension Commonly known as: PEPTO BISMOL Take 30 mLs by mouth 4 (four) times daily for 14 days.   doxycycline 100 MG tablet Commonly known as: VIBRA-TABS Take 1 tablet (100 mg total) by mouth 2 (two) times daily for 14 days.   metroNIDAZOLE 250 MG tablet Commonly known as: FLAGYL Take 1 tablet (250 mg total) by mouth 4 (four) times daily for 14 days.   pantoprazole 40 MG tablet Commonly known as: PROTONIX Take 1 tablet (40 mg total) by mouth 2 (two) times daily.   sucralfate 1 GM/10ML suspension Commonly known as: CARAFATE Take 10 mLs (1 g total) by mouth every 6 (six) hours for 14 days.   tamsulosin 0.4 MG Caps capsule Commonly known as: Flomax Take 1 capsule (0.4 mg total) by mouth daily after supper.         The results of significant diagnostics from this hospitalization (including imaging, microbiology, ancillary and laboratory) are listed below for reference.    Procedures and Diagnostic Studies:   CT ABDOMEN PELVIS WO CONTRAST  Result Date: 11/25/2021 CLINICAL DATA:  Generalized acute abdominal pain. EXAM: CT ABDOMEN AND PELVIS WITHOUT CONTRAST TECHNIQUE: Multidetector CT imaging of the abdomen and pelvis was performed following the standard  protocol without IV contrast. RADIATION DOSE REDUCTION: This exam was performed according to the departmental dose-optimization program which includes automated exposure control, adjustment of the mA and/or kV according to patient size and/or use of iterative reconstruction technique. COMPARISON:  None. FINDINGS: Lower chest: No acute findings. 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 Hepatobiliary: No mass visualized on this unenhanced exam. Gallbladder is unremarkable. No evidence of biliary ductal dilatation. Pancreas: No mass or inflammatory process visualized on this unenhanced exam. Spleen:  Within normal limits in size. Adrenals/Urinary tract: No evidence of urolithiasis or hydronephrosis. Distended urinary bladder noted, which could be due to urinary retention. Stomach/Bowel: Stomach is dilated, however duodenum and remainder of bowel is normal in caliber. This could be due to gastric outlet obstruction or gastroparesis. No evidence of obstruction, inflammatory process, or abnormal fluid collections. Vascular/Lymphatic: No pathologically enlarged lymph nodes identified. No evidence of abdominal aortic aneurysm. Reproductive:  Mildly enlarged prostate. Other:  None. Musculoskeletal:  No suspicious bone lesions identified. IMPRESSION: Dilated stomach, which may be due to gastric outlet obstruction or gastroparesis. No dilated bowel loops. Mildly enlarged prostate. Distended urinary bladder; urinary retention cannot be excluded. Clinical correlation is recommended. Electronically Signed   By: Marlaine Hind M.D.   On: 11/25/2021 12:18   DG Chest Port 1 View  Result Date: 11/25/2021 CLINICAL DATA:  61 year old male with history of weakness and abdominal pain for the past 7 days. EXAM: PORTABLE CHEST 1 VIEW COMPARISON:  No priors. FINDINGS: Lung volumes are normal. Calcified pleural plaques are noted in the right hemithorax. No definite left-sided calcified pleural plaques are noted. No  consolidative airspace disease. No pleural effusions. No pneumothorax. No pulmonary nodule or mass noted. Pulmonary vasculature and the cardiomediastinal silhouette are within normal limits. IMPRESSION: 1. No radiographic evidence of acute cardiopulmonary disease. 2. Calcified right-sided pleural plaques, likely related to remote right-sided empyema or prior right-sided thoracic trauma. Electronically Signed   By: Vinnie Langton M.D.   On: 11/25/2021 10:19     Labs:   Basic Metabolic Panel: Recent Labs  Lab 11/25/21 1030 11/25/21 1047 11/26/21 0100 11/27/21 0245 11/28/21 0342 11/29/21 0339  NA 140 138 138 137 139 135  K 3.9 4.1  3.9 2.9* 3.6 3.5  CL 106 104 107 105 105 102  CO2 18*  --  $R'25 25 28 25  'qZ$ GLUCOSE 155* 149* 259* 193* 200* 205*  BUN 24* 27* 21 11 <5* 6*  CREATININE 1.95* 1.90* 1.10 0.84 0.86 0.89  CALCIUM 7.2*  --  7.9* 7.7* 7.9* 8.3*   GFR Estimated Creatinine Clearance: 52.6 mL/min (by C-G formula based on SCr of 0.89 mg/dL). Liver Function Tests: Recent Labs  Lab 11/25/21 1030 11/26/21 0100 11/27/21 0245 11/28/21 0342 11/29/21 0339  AST 75* 95* 47* 45* 41  ALT 52* 68* 54* 54* 52*  ALKPHOS 63 54 45 54 55  BILITOT 0.7 0.6 0.5 0.3 0.2*  PROT 5.4* 5.0* 4.4* 4.6* 4.9*  ALBUMIN 2.4* 2.1* 2.0* 2.1* 2.2*   Recent Labs  Lab 11/25/21 1030  LIPASE 30   No results for input(s): AMMONIA in the last 168 hours. Coagulation profile Recent Labs  Lab 11/25/21 1030  INR 1.4*    CBC: Recent Labs  Lab 11/25/21 1030 11/25/21 1047 11/26/21 0100 11/26/21 0752 11/27/21 0245 11/28/21 0342 11/29/21 0339  WBC 21.9*  --  12.9*  --  11.2* 5.5 5.7  NEUTROABS 19.7*  --   --   --  9.1* 3.1 3.1  HGB 7.7*   < > 6.2* 8.1* 7.2* 8.1* 8.2*  HCT 25.7*   < > 19.7* 24.9* 21.0* 23.7* 25.5*  MCV 91.1  --  85.3  --  83.3 83.5 85.6  PLT 335  --  291  --  290 286 290   < > = values in this interval not displayed.   Cardiac Enzymes: No results for input(s): CKTOTAL, CKMB,  CKMBINDEX, TROPONINI in the last 168 hours. BNP: Invalid input(s): POCBNP CBG: Recent Labs  Lab 11/29/21 1121 11/29/21 1627 11/29/21 2133 11/30/21 0748 11/30/21 1704  GLUCAP 161* 116* 169* 140* 178*   D-Dimer No results for input(s): DDIMER in the last 72 hours. Hgb A1c No results for input(s): HGBA1C in the last 72 hours. Lipid Profile No results for input(s): CHOL, HDL, LDLCALC, TRIG, CHOLHDL, LDLDIRECT in the last 72 hours. Thyroid function studies No results for input(s): TSH, T4TOTAL, T3FREE, THYROIDAB in the last 72 hours.  Invalid input(s): FREET3 Anemia work up No results for input(s): VITAMINB12, FOLATE, FERRITIN, TIBC, IRON, RETICCTPCT in the last 72 hours. Microbiology Recent Results (from the past 240 hour(s))  Urine Culture     Status: Abnormal   Collection Time: 11/25/21  9:50 AM   Specimen: Urine, Clean Catch  Result Value Ref Range Status   Specimen Description URINE, CLEAN CATCH  Final   Special Requests   Final    NONE Performed at [facility], [place] [address] [place], [place] [address]    Culture 10,000 COLONIES/mL ESCHERICHIA COLI (A)  Final   Report Status 11/27/2021 FINAL  Final   Organism ID, Bacteria ESCHERICHIA COLI (A)  Final      Susceptibility   Escherichia coli - MIC*    AMPICILLIN >=32 RESISTANT Resistant     CEFAZOLIN <=4 SENSITIVE Sensitive     CEFEPIME <=0.12 SENSITIVE Sensitive     CEFTRIAXONE <=0.25 SENSITIVE Sensitive     CIPROFLOXACIN <=0.25 SENSITIVE Sensitive     GENTAMICIN <=1 SENSITIVE Sensitive     IMIPENEM <=0.25 SENSITIVE Sensitive     NITROFURANTOIN <=16 SENSITIVE Sensitive     TRIMETH/SULFA <=20 SENSITIVE Sensitive     AMPICILLIN/SULBACTAM >=32 RESISTANT Resistant     PIP/TAZO <=4 SENSITIVE Sensitive     *  10,000 COLONIES/mL ESCHERICHIA COLI  Resp Panel by RT-PCR (Flu A&B, Covid) Nasopharyngeal Swab     Status: None   Collection Time: 11/25/21 10:30 AM   Specimen: Nasopharyngeal Swab; Nasopharyngeal(NP) swabs  in vial transport medium  Result Value Ref Range Status   SARS Coronavirus 2 by RT PCR NEGATIVE NEGATIVE Final    Comment: (NOTE) SARS-CoV-2 target nucleic acids are NOT DETECTED.  The SARS-CoV-2 RNA is generally detectable in upper respiratory specimens during the acute phase of infection. The lowest concentration of SARS-CoV-2 viral copies this assay can detect is 138 copies/mL. A negative result does not preclude SARS-Cov-2 infection and should not be used as the sole basis for treatment or other patient management decisions. A negative result may occur with  improper specimen collection/handling, submission of specimen other than nasopharyngeal swab, presence of viral mutation(s) within the areas targeted by this assay, and inadequate number of viral copies(<138 copies/mL). A negative result must be combined with clinical observations, patient history, and epidemiological information. The expected result is Negative.  Fact Sheet for Patients:  EntrepreneurPulse.com.au  Fact Sheet for Healthcare Providers:  IncredibleEmployment.be  This test is no t yet approved or cleared by the [place] FDA and  has been authorized for detection and/or diagnosis of SARS-CoV-2 by FDA under an Emergency Use Authorization (EUA). This EUA will remain  in effect (meaning this test can be used) for the duration of the COVID-19 declaration under Section 564(b)(1) of the Act, 21 U.S.C.section 360bbb-3(b)(1), unless the authorization is terminated  or revoked sooner.       Influenza A by PCR NEGATIVE NEGATIVE Final   Influenza B by PCR NEGATIVE NEGATIVE Final    Comment: (NOTE) The Xpert Xpress SARS-CoV-2/FLU/RSV plus assay is intended as an aid in the diagnosis of influenza from Nasopharyngeal swab specimens and should not be used as a sole basis for treatment. Nasal washings and aspirates are unacceptable for Xpert Xpress  SARS-CoV-2/FLU/RSV testing.  Fact Sheet for Patients: EntrepreneurPulse.com.au  Fact Sheet for Healthcare Providers: IncredibleEmployment.be  This test is not yet approved or cleared by the [place] FDA and has been authorized for detection and/or diagnosis of SARS-CoV-2 by FDA under an Emergency Use Authorization (EUA). This EUA will remain in effect (meaning this test can be used) for the duration of the COVID-19 declaration under Section 564(b)(1) of the Act, 21 U.S.C. section 360bbb-3(b)(1), unless the authorization is terminated or revoked.  Performed at [facility], [place] [address], [place], [place] [address]   Culture, blood (routine x 2)     Status: None   Collection Time: 11/25/21 11:26 AM   Specimen: BLOOD  Result Value Ref Range Status   Specimen Description BLOOD RIGHT ANTECUBITAL  Final   Special Requests   Final    BOTTLES DRAWN AEROBIC AND ANAEROBIC Blood Culture adequate volume   Culture   Final    NO GROWTH 5 DAYS Performed at [facility], [place] [address], [place], [place] [address]    Report Status 11/30/2021 FINAL  Final  Culture, blood (routine x 2)     Status: None   Collection Time: 11/25/21 11:31 AM   Specimen: BLOOD  Result Value Ref Range Status   Specimen Description BLOOD BLOOD LEFT FOREARM  Final   Special Requests   Final    BOTTLES DRAWN AEROBIC AND ANAEROBIC Blood Culture adequate volume   Culture   Final    NO GROWTH 5 DAYS Performed at [facility], [address] [place], [place] [address]    Report Status 11/30/2021 FINAL  Final    Time coordinating discharge: 35 minutes  Signed: Kelsie Kramp  [facility] 12/01/2021, 1:55 PM

## 2021-12-20 ENCOUNTER — Other Ambulatory Visit (INDEPENDENT_AMBULATORY_CARE_PROVIDER_SITE_OTHER): Payer: Commercial Managed Care - PPO

## 2021-12-20 ENCOUNTER — Encounter: Payer: Self-pay | Admitting: Gastroenterology

## 2021-12-20 ENCOUNTER — Ambulatory Visit (INDEPENDENT_AMBULATORY_CARE_PROVIDER_SITE_OTHER): Payer: Commercial Managed Care - PPO | Admitting: Gastroenterology

## 2021-12-20 VITALS — BP 90/60 | HR 68 | Ht 61.0 in | Wt 102.4 lb

## 2021-12-20 DIAGNOSIS — B9681 Helicobacter pylori [H. pylori] as the cause of diseases classified elsewhere: Secondary | ICD-10-CM | POA: Diagnosis not present

## 2021-12-20 DIAGNOSIS — K311 Adult hypertrophic pyloric stenosis: Secondary | ICD-10-CM | POA: Diagnosis not present

## 2021-12-20 DIAGNOSIS — K259 Gastric ulcer, unspecified as acute or chronic, without hemorrhage or perforation: Secondary | ICD-10-CM

## 2021-12-20 DIAGNOSIS — K254 Chronic or unspecified gastric ulcer with hemorrhage: Secondary | ICD-10-CM | POA: Diagnosis not present

## 2021-12-20 DIAGNOSIS — T39395A Adverse effect of other nonsteroidal anti-inflammatory drugs [NSAID], initial encounter: Secondary | ICD-10-CM | POA: Diagnosis not present

## 2021-12-20 LAB — CBC WITH DIFFERENTIAL/PLATELET
Basophils Absolute: 0 10*3/uL (ref 0.0–0.1)
Basophils Relative: 1.1 % (ref 0.0–3.0)
Eosinophils Absolute: 0.3 10*3/uL (ref 0.0–0.7)
Eosinophils Relative: 7.4 % — ABNORMAL HIGH (ref 0.0–5.0)
HCT: 28.7 % — ABNORMAL LOW (ref 39.0–52.0)
Hemoglobin: 9.1 g/dL — ABNORMAL LOW (ref 13.0–17.0)
Lymphocytes Relative: 38.9 % (ref 12.0–46.0)
Lymphs Abs: 1.4 10*3/uL (ref 0.7–4.0)
MCHC: 31.7 g/dL (ref 30.0–36.0)
MCV: 78.5 fl (ref 78.0–100.0)
Monocytes Absolute: 0.6 10*3/uL (ref 0.1–1.0)
Monocytes Relative: 16.1 % — ABNORMAL HIGH (ref 3.0–12.0)
Neutro Abs: 1.3 10*3/uL — ABNORMAL LOW (ref 1.4–7.7)
Neutrophils Relative %: 36.5 % — ABNORMAL LOW (ref 43.0–77.0)
Platelets: 343 10*3/uL (ref 150.0–400.0)
RBC: 3.66 Mil/uL — ABNORMAL LOW (ref 4.22–5.81)
RDW: 20.4 % — ABNORMAL HIGH (ref 11.5–15.5)
WBC: 3.7 10*3/uL — ABNORMAL LOW (ref 4.0–10.5)

## 2021-12-20 NOTE — Progress Notes (Signed)
HPI : Christian Owens is a 61 y.o. male of [place] origin with PMH significant for GERD who presents to clinic today for follow-up of recent hospitalization for large gastric ulcer with partial gastric outlet obstruction. Patient presented to the ED on 2/10 with complaint of nausea, vomiting, abdominal pain.  He previously had an chronic mild abdominal discomfort due to reflux for which he is on a medication..  In the 3 weeks prior to his admission the pain progressively worsened and he had worsening oral intolerance.  Reports 30 pound of unintentional weight loss in the interval. EMS noted his blood pressure low at 70s and gave him 2 L normal saline with improvement in blood pressure to 96/50.   In the ED, patient was afebrile, heart rate in 80s, blood pressure in low 100s, breathing on room air. Initial lab with BUN/creatinine 24/1.95, AST/ALT slightly elevated to 75/52 with normal alk phos and bilirubin, INR 1.4. Lactic acid level significantly elevated to 8.5, WBC count elevated to 21.9, hemoglobin low at 7.7. CT abdomen showed dilated stomach suggestive of GOO or gastroparesis.  No evidence of dilated bowel loops. Admitted to hospitalist service. GI was consulted.  Patient underwent emergent EGD  The EGD on February 10 was notable for a very large ulcer involving the incisura and antrum with partial gastric outlet obstruction.  The endoscope was able to traverse the stenosis with considerable difficulty.  Biopsies of the ulcer were positive for H. pylori and gastric intestinal metaplasia, but negative for malignancy.  He was treated supportively with PPI, Carafate, and discharged on quadruple therapy.  He was discharged home after 4 days total.  He did receive a unit of packed red blood cells after his hemoglobin dropped below 7 the day after the procedure.   The patient and his son are not exactly sure if they completed the quadruple therapy.  He said that there was one medication that they  did not pick up from the pharmacy, but they are not sure which one.  He thinks he is still taking the pantoprazole twice a day.  Today, the patient is feeling much better.  He still has some mild burning epigastric pain, but nothing compared to the pain he had 3 weeks ago.  He is not having any nausea or vomiting.  He is eating a regular diet.  He has regained 7 pounds in the past 3 weeks.  He went back to work 2 days after his discharge from the hospital.  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  He denies any symptoms of fatigue, shortness of breath, chest pain.  He is having regular bowel movements, usually 2/day.  Stool appears normal, not black or bloody.  He had been taking Aleve prior to admission, but has not taken any since his hospitalization.    Past Medical History:  Diagnosis Date   GERD (gastroesophageal reflux disease)    EGD November 25, 2020: LA grade a esophagitis, large friable cratered gastric ulcer at the incisura and lesser curvature of the gastric antrum/prepyloric region of stomach, causing deformity and stenosis of the pylorus.  The lesion was 30 mm in largest dimension.  Clotted blood and contact bleeding of the ulcer was noted, but no active bleeding.  Normal duodenum.  Past Surgical History:  Procedure Laterality Date   BIOPSY  11/25/2021   Procedure: BIOPSY;  Surgeon: Daryel November, MD;  [facility] ENDOSCOPY;  Service: Gastroenterology;;   ESOPHAGOGASTRODUODENOSCOPY (EGD) WITH  PROPOFOL N/A 11/25/2021   Procedure: ESOPHAGOGASTRODUODENOSCOPY (EGD) WITH PROPOFOL;  Surgeon: Daryel November, MD;  Location: [facility];  Service: Gastroenterology;  Laterality: N/A;   History reviewed. No pertinent family history. Social History   Tobacco Use   Smoking status: Every Day    Packs/day: 0.50    Types: Cigarettes   Smokeless tobacco: Never  Substance Use Topics   Alcohol use: Yes    Comment: occasional   Drug use: Never   Current  Outpatient Medications  Medication Sig Dispense Refill   acetaminophen (TYLENOL) 500 MG tablet Take 500 mg by mouth every 6 (six) hours as needed for headache.     pantoprazole (PROTONIX) 40 MG tablet Take 1 tablet (40 mg total) by mouth 2 (two) times daily. 60 tablet 2   sucralfate (CARAFATE) 1 GM/10ML suspension Take 10 mLs (1 g total) by mouth every 6 (six) hours for 14 days. 560 mL 0   tamsulosin (FLOMAX) 0.4 MG CAPS capsule Take 1 capsule (0.4 mg total) by mouth daily after supper. 30 capsule 2   No current facility-administered medications for this visit.   No Known Allergies   Review of Systems: All systems reviewed and negative except where noted in HPI.    CT ABDOMEN PELVIS WO CONTRAST  Result Date: 11/25/2021 CLINICAL DATA:  Generalized acute abdominal pain. EXAM: CT ABDOMEN AND PELVIS WITHOUT CONTRAST TECHNIQUE: Multidetector CT imaging of the abdomen and pelvis was performed following the standard protocol without IV contrast. RADIATION DOSE REDUCTION: This exam was performed according to the departmental dose-optimization program which includes automated exposure control, adjustment of the mA and/or kV according to patient size and/or use of iterative reconstruction technique. COMPARISON:  None. FINDINGS: Lower chest: No acute findings. 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 Hepatobiliary: No mass visualized on this unenhanced exam. Gallbladder is unremarkable. No evidence of biliary ductal dilatation. Pancreas: No mass or inflammatory process visualized on this unenhanced exam. Spleen:  Within normal limits in size. Adrenals/Urinary tract: No evidence of urolithiasis or hydronephrosis. Distended urinary bladder noted, which could be due to urinary retention. Stomach/Bowel: Stomach is dilated, however duodenum and remainder of bowel is normal in caliber. This could be due to gastric outlet obstruction or gastroparesis. No evidence of obstruction, inflammatory  process, or abnormal fluid collections. Vascular/Lymphatic: No pathologically enlarged lymph nodes identified. No evidence of abdominal aortic aneurysm. Reproductive:  Mildly enlarged prostate. Other:  None. Musculoskeletal:  No suspicious bone lesions identified. IMPRESSION: Dilated stomach, which may be due to gastric outlet obstruction or gastroparesis. No dilated bowel loops. Mildly enlarged prostate. Distended urinary bladder; urinary retention cannot be excluded. Clinical correlation is recommended. Electronically Signed   By: Marlaine Hind M.D.   On: 11/25/2021 12:18   CT CHEST ABDOMEN PELVIS W CONTRAST  Result Date: 11/30/2021 CLINICAL DATA:  Gastric cancer.  Staging. EXAM: CT CHEST, ABDOMEN, AND PELVIS WITH CONTRAST TECHNIQUE: Multidetector CT imaging of the chest, abdomen and pelvis was performed following the standard protocol during bolus administration of intravenous contrast. RADIATION DOSE REDUCTION: This exam was performed according to the departmental dose-optimization program which includes automated exposure control, adjustment of the mA and/or kV according to patient size and/or use of iterative reconstruction technique. CONTRAST:  43m OMNIPAQUE IOHEXOL 300 MG/ML  SOLN COMPARISON:  Abdomen/pelvis CT 11/25/2021. FINDINGS: CT CHEST FINDINGS Cardiovascular: The heart size is normal. No substantial pericardial effusion. No thoracic aortic aneurysm. Mediastinum/Nodes: No mediastinal lymphadenopathy. There is no hilar lymphadenopathy. The esophagus has normal imaging  features. There is no axillary lymphadenopathy. Lungs/Pleura: Centrilobular and paraseptal emphysema evident. 6.4 x 2.3 cm calcified pleural lesion identified posterior right lung base. A second area of tubular calcified plaque-like pleural thickening noted anterior right middle lobe. Subtle calcified pleural plaque identified anterior left upper lobe on 75/5. Additional subtle calcified pleural plaque identified paraspinal left lower  lobe on 01/02/5. No suspicious pulmonary parenchymal nodule or mass. No focal airspace consolidation. No pleural effusion. Musculoskeletal: No worrisome lytic or sclerotic osseous abnormality. CT ABDOMEN PELVIS FINDINGS Hepatobiliary: No suspicious focal abnormality within the liver parenchyma. Nodular hepatic contour is more conspicuous on the current study. Vascular malformation noted along the capsular surface of the medial right liver on 59/3. Gallbladder is decompressed. No intrahepatic or extrahepatic biliary dilation. Pancreas: No focal mass lesion. No dilatation of the main duct. No intraparenchymal cyst. No peripancreatic edema. Spleen: No splenomegaly. No focal mass lesion. Adrenals/Urinary Tract: No adrenal nodule or mass. Stomach/Bowel: As before, stomach is markedly distended with food and fluid. Duodenum is nondistended. No small bowel wall thickening. No small bowel dilatation. The terminal ileum is normal. The appendix is normal. No gross colonic mass. No colonic wall thickening. Vascular/Lymphatic: There is mild atherosclerotic calcification of the abdominal aorta without aneurysm. Portal vein is patent. There is no gastrohepatic or hepatoduodenal ligament lymphadenopathy. No retroperitoneal or mesenteric lymphadenopathy. There is a single small lymph node adjacent to the distal stomach (see image 63/3). No pelvic sidewall lymphadenopathy. Reproductive: Prostate gland is enlarged. Other: No intraperitoneal free fluid. Musculoskeletal: No worrisome lytic or sclerotic osseous abnormality. IMPRESSION: 1. No definite evidence for metastatic disease in the chest, abdomen, or pelvis. Single small lymph node is identified adjacent to the distal stomach but is not enlarged by CT size criteria. 2. Calcified pleural plaques, right greater than left. Findings raise the question of previous asbestos exposure. 3. Nodular hepatic contour compatible with cirrhosis. 4. Marked distention of the stomach with food and  fluid although distention is decreased in the interval since prior study. 5. Prostatomegaly. 6. Aortic Atherosclerosis (ICD10-I70.0) and Emphysema (ICD10-J43.9). Electronically Signed   By: Misty Stanley M.D.   On: 11/30/2021 20:34   DG Chest Port 1 View  Result Date: 11/25/2021 CLINICAL DATA:  61 year old male with history of weakness and abdominal pain for the past 7 days. EXAM: PORTABLE CHEST 1 VIEW COMPARISON:  No priors. FINDINGS: Lung volumes are normal. Calcified pleural plaques are noted in the right hemithorax. No definite left-sided calcified pleural plaques are noted. No consolidative airspace disease. No pleural effusions. No pneumothorax. No pulmonary nodule or mass noted. Pulmonary vasculature and the cardiomediastinal silhouette are within normal limits. IMPRESSION: 1. No radiographic evidence of acute cardiopulmonary disease. 2. Calcified right-sided pleural plaques, likely related to remote right-sided empyema or prior right-sided thoracic trauma. Electronically Signed   By: Vinnie Langton M.D.   On: 11/25/2021 10:19    Physical Exam: BP 90/60 (BP Location: Left Arm, Patient Position: Sitting, Cuff Size: Normal)    Pulse 68    Ht 5' 1" (1.549 m)    Wt 102 lb 6 oz (46.4 kg)    BMI 19.34 kg/m  Constitutional: Pleasant,well-developed, Asian male in no acute distress.  Accompanied by son.  Patient understands English fairly well, but patient's son provides additional translation information. HEENT: Normocephalic and atraumatic. Conjunctivae are normal. No scleral icterus.  Poor dentition with multiple cracked teeth, but no loose teeth. Neck supple.  Cardiovascular: Normal rate, regular rhythm.  Pulmonary/chest: Effort normal and breath sounds normal. No  wheezing, rales or rhonchi. Abdominal: Soft, nondistended, nontender. Bowel sounds active throughout. There are no masses palpable. No hepatomegaly. Extremities: no edema Neurological: Alert and oriented to person place and time. Skin:  Skin is warm and dry. No rashes noted. Psychiatric: Normal mood and affect. Behavior is normal.  CBC    Component Value Date/Time   WBC 5.7 11/29/2021 0339   RBC 2.98 (L) 11/29/2021 0339   HGB 8.2 (L) 11/29/2021 0339   HCT 25.5 (L) 11/29/2021 0339   PLT 290 11/29/2021 0339   MCV 85.6 11/29/2021 0339   MCH 27.5 11/29/2021 0339   MCHC 32.2 11/29/2021 0339   RDW 16.6 (H) 11/29/2021 0339   LYMPHSABS 1.5 11/29/2021 0339   MONOABS 0.7 11/29/2021 0339   EOSABS 0.3 11/29/2021 0339   BASOSABS 0.0 11/29/2021 0339    CMP     Component Value Date/Time   NA 135 11/29/2021 0339   K 3.5 11/29/2021 0339   CL 102 11/29/2021 0339   CO2 25 11/29/2021 0339   GLUCOSE 205 (H) 11/29/2021 0339   BUN 6 (L) 11/29/2021 0339   CREATININE 0.89 11/29/2021 0339   CALCIUM 8.3 (L) 11/29/2021 0339   PROT 4.9 (L) 11/29/2021 0339   ALBUMIN 2.2 (L) 11/29/2021 0339   AST 41 11/29/2021 0339   ALT 52 (H) 11/29/2021 0339   ALKPHOS 55 11/29/2021 0339   BILITOT 0.2 (L) 11/29/2021 0339   GFRNONAA >60 11/29/2021 0339     ASSESSMENT AND PLAN: 61 year old male admitted a month ago with large gastric ulcer with near obstruction, initially concerning for malignancy.  Biopsy of the ulcer revealed H. pylori and gastric intestinal metaplasia, but no malignancy.  He has responded well to appropriate peptic ulcer treatment with PPI, Carafate and quadruple therapy, although it is unclear if the patient completed quadruple therapy as prescribed. Currently the patient is tolerating a regular diet without any nausea or vomiting.  He is regaining the weight that he has lost.  This is all very reassuring that the ulcer is healing and regressing. Need to repeat an upper endoscopy to assess healing of ulcer and eradication of H. pylori.  We will plan to do this in mid to late April.  He should remain on pantoprazole twice daily until his repeat EGD. We will repeat a CBC today to ensure that his hemoglobin is appropriately  increasing.  If not, we can add iron supplementation.  Gastric ulcer, H. pylori positive, with gastric outlet obstruction - Repeat EGD mid to late April with gastric biopsies to assess for H. pylori eradication - Continue pantoprazole 40 mg twice daily - Continue to avoid NSAIDs - Repeat CBC  The details, risks (including bleeding, perforation, infection, missed lesions, medication reactions and possible hospitalization or surgery if complications occur), benefits, and alternatives to EGD with possible biopsy and possible dilation were discussed with the patient and he consents to proceed.   Shane Badeaux E. Candis Schatz, MD [facility]   No ref. provider found

## 2021-12-20 NOTE — Patient Instructions (Addendum)
If you are age 63 or younger, your body mass index should be between 19-25. Your Body mass index is 19.34 kg/m?Marland Kitchen If this is out of the aformentioned range listed, please consider follow up with your Primary Care Provider.  ?________________________________________________________ ? ?The [place] [facility] providers would like to encourage you to use [facility] to communicate with providers for non-urgent requests or questions.  Due to long hold times on the telephone, sending your provider a message by [facility] may be a faster and more efficient way to get a response.  Please allow 48 business hours for a response.  Please remember that this is for non-urgent requests.  ?_______________________________________________________ ? ?Your provider has requested that you go to the basement level for lab work before leaving today. Press "B" on the elevator. The lab is located at the first door on the left as you exit the elevator. ? ?Due to recent changes in healthcare laws, you may see the results of your imaging and laboratory studies on MyChart before your provider has had a chance to review them.  We understand that in some cases there may be results that are confusing or concerning to you. Not all laboratory results come back in the same time frame and the provider may be waiting for multiple results in order to interpret others.  Please give [place] 48 hours in order for your provider to thoroughly review all the results before contacting the office for clarification of your results.  ? ?Please take Protonix 40mg  one tablet twice daily until your procedure on 02-06-22. ? ?You have been scheduled for an endoscopy. Please follow written instructions given to you at your visit today. ?If you use inhalers (even only as needed), please bring them with you on the day of your procedure. ? ?Thank you for entrusting me with your care and choosing [facility]. ? ?Dr [facility] ?

## 2021-12-21 NOTE — Progress Notes (Signed)
Mr. Decarlo,  ?Your hemoglobin increased one point in the past 3 weeks.  I think it is okay not to start iron supplements since you are otherwise feeling well.  See you next month for your EGD (upper endoscopy).

## 2022-01-03 ENCOUNTER — Telehealth: Payer: Self-pay | Admitting: Nurse Practitioner

## 2022-01-03 NOTE — Telephone Encounter (Signed)
called both numbers - one had no vm and i left a vm on the other line asking for a call back. sent new patient confirmtion and new pt docs thru [place] that is active ?

## 2022-01-04 ENCOUNTER — Ambulatory Visit: Payer: Commercial Managed Care - PPO | Admitting: Nurse Practitioner

## 2022-01-10 ENCOUNTER — Telehealth: Payer: Self-pay

## 2022-01-10 NOTE — Telephone Encounter (Signed)
-----   Message from Chrystie Nose, RN sent at 01/05/2022  4:57 PM EDT ----- ? ?----- Message ----- ?From: Jenel Lucks, MD ?Sent: 01/05/2022  12:47 PM EDT ?To: Chrystie Nose, RN ? ?[name],  ?Can you call Christian Owens next week to ensure that he is still taking the pantoprazole twice daily for his large stomach ulcer? ? ?thanks ?----- Message ----- ?From: Gaynelle Adu, MD ?Sent: 01/05/2022  10:51 AM EDT ?To: Jenel Lucks, MD ? ?I saw this guy today.  Appears he is no longer taking Protonix per the patient and stepgranddaughter..  I sent in a prescription for twice a day for 60 days.  You may want to have one of your team members reach out in a few days to verify that they got the prescription. ? ?I agree with repeat endoscopy.  My office note is in care everywhere ? ?Thanks ?Eric ? ?Mary Sella. Andrey Campanile, MD, FACS ?General, Bariatric, & Minimally Invasive Surgery ?[facility] ? ? ? ? ?

## 2022-01-10 NOTE — Telephone Encounter (Signed)
Attempted to reach pt on his home phone twice. The line rings and then goes to a fast busy signal. Unable to leave a vm. ? ?I attempted to reach pt's granddaughter. I left her a vm asking that she have pt give [place] a call back. ?

## 2022-01-12 NOTE — Telephone Encounter (Signed)
2nd attempt to reach pt at home phone number. The line rings and then there is a fast busy signal. No prompt to leave a vm.  ? ?I called and spoke with patient's granddaughter. She reports that pt it taking his Protonix BID, she knows that pt should continue his Protonix and keep appt as scheduled. Pt's granddaughter verbalized understanding and had no concerns at the end of the call. ?

## 2022-02-06 ENCOUNTER — Telehealth: Payer: Self-pay | Admitting: Gastroenterology

## 2022-02-06 ENCOUNTER — Encounter: Payer: Commercial Managed Care - PPO | Admitting: Gastroenterology

## 2022-02-06 NOTE — Telephone Encounter (Signed)
Good Morning Dr. Tomasa Rand, ? ?I called this patient at 9:15 am today to see if he  was coming for his procedure, I did not speak to anyone however, I left a message on his answering machine to call if he would like to reschedule. ? ?I will NO SHOW this patient ?

## 2022-03-03 ENCOUNTER — Telehealth: Payer: Self-pay | Admitting: Gastroenterology

## 2022-03-03 NOTE — Telephone Encounter (Signed)
We received a call from patient regarding symptoms. Per patient, has abd pain. Patient scheduled an OV 03/28/22. Patient wants to know if there is anything he can do or take for that abd pain while they wait for OV? Please advise.

## 2022-03-03 NOTE — Telephone Encounter (Signed)
Left message for pt to call back  °

## 2022-03-04 ENCOUNTER — Ambulatory Visit: Payer: Commercial Managed Care - PPO

## 2022-03-06 NOTE — Telephone Encounter (Signed)
Pt did not return phone call, will await further communication from pt. 

## 2022-03-28 ENCOUNTER — Encounter: Payer: Self-pay | Admitting: Gastroenterology

## 2022-03-28 ENCOUNTER — Other Ambulatory Visit (INDEPENDENT_AMBULATORY_CARE_PROVIDER_SITE_OTHER): Payer: Commercial Managed Care - PPO

## 2022-03-28 ENCOUNTER — Ambulatory Visit (INDEPENDENT_AMBULATORY_CARE_PROVIDER_SITE_OTHER): Payer: Commercial Managed Care - PPO | Admitting: Gastroenterology

## 2022-03-28 VITALS — BP 104/72 | HR 78 | Ht 61.0 in | Wt 96.0 lb

## 2022-03-28 DIAGNOSIS — K254 Chronic or unspecified gastric ulcer with hemorrhage: Secondary | ICD-10-CM

## 2022-03-28 LAB — CBC WITH DIFFERENTIAL/PLATELET
Basophils Absolute: 0.1 10*3/uL (ref 0.0–0.1)
Basophils Relative: 1.4 % (ref 0.0–3.0)
Eosinophils Absolute: 0.1 10*3/uL (ref 0.0–0.7)
Eosinophils Relative: 1.6 % (ref 0.0–5.0)
HCT: 28.3 % — ABNORMAL LOW (ref 39.0–52.0)
Hemoglobin: 8.6 g/dL — ABNORMAL LOW (ref 13.0–17.0)
Lymphocytes Relative: 27 % (ref 12.0–46.0)
Lymphs Abs: 1.8 10*3/uL (ref 0.7–4.0)
MCHC: 30.2 g/dL (ref 30.0–36.0)
MCV: 64.9 fl — ABNORMAL LOW (ref 78.0–100.0)
Monocytes Absolute: 0.8 10*3/uL (ref 0.1–1.0)
Monocytes Relative: 12.1 % — ABNORMAL HIGH (ref 3.0–12.0)
Neutro Abs: 3.8 10*3/uL (ref 1.4–7.7)
Neutrophils Relative %: 57.9 % (ref 43.0–77.0)
Platelets: 356 10*3/uL (ref 150.0–400.0)
RBC: 4.37 Mil/uL (ref 4.22–5.81)
RDW: 21.5 % — ABNORMAL HIGH (ref 11.5–15.5)
WBC: 6.6 10*3/uL (ref 4.0–10.5)

## 2022-03-28 LAB — COMPREHENSIVE METABOLIC PANEL
ALT: 26 U/L (ref 0–53)
AST: 36 U/L (ref 0–37)
Albumin: 3.3 g/dL — ABNORMAL LOW (ref 3.5–5.2)
Alkaline Phosphatase: 83 U/L (ref 39–117)
BUN: 9 mg/dL (ref 6–23)
CO2: 26 mEq/L (ref 19–32)
Calcium: 8.7 mg/dL (ref 8.4–10.5)
Chloride: 102 mEq/L (ref 96–112)
Creatinine, Ser: 0.86 mg/dL (ref 0.40–1.50)
GFR: 92.41 mL/min (ref >60.00)
Glucose, Bld: 139 mg/dL — ABNORMAL HIGH (ref 70–99)
Potassium: 4 mEq/L (ref 3.5–5.1)
Sodium: 135 mEq/L (ref 135–145)
Total Bilirubin: 1 mg/dL (ref 0.2–1.2)
Total Protein: 7.5 g/dL (ref 6.0–8.3)

## 2022-03-28 MED ORDER — PANTOPRAZOLE SODIUM 40 MG PO TBEC: 40.0000 mg | DELAYED_RELEASE_TABLET | Freq: Two times a day (BID) | ORAL | 3 refills | Status: AC

## 2022-03-28 MED ORDER — SUCRALFATE 1 G PO TABS: 1.0000 g | ORAL_TABLET | Freq: Two times a day (BID) | ORAL | 0 refills | Status: AC

## 2022-03-28 NOTE — Patient Instructions (Signed)
If you are age 63 or older, your body mass index should be between 23-30. Your Body mass index is 18.14 kg/m. If this is out of the aforementioned range listed, please consider follow up with your Primary Care Provider.  If you are age 73 or younger, your body mass index should be between 19-25. Your Body mass index is 18.14 kg/m. If this is out of the aformentioned range listed, please consider follow up with your Primary Care Provider.   Your provider has requested that you go to the basement level for lab work before leaving today. Press "B" on the elevator. The lab is located at the first door on the left as you exit the elevator.  We have sent the following medications to your pharmacy for you to pick up at your convenience: Pantoprazole 40 mg twice daily , Carafate 1 gm twice daily.  The [place] [facility] providers would like to encourage you to use [facility] to communicate with providers for non-urgent requests or questions.  Due to long hold times on the telephone, sending your provider a message by [facility] may be a faster and more efficient way to get a response.  Please allow 48 business hours for a response.  Please remember that this is for non-urgent requests.   It was a pleasure to see you today!  Thank you for trusting me with your gastrointestinal care!    Scott E.Tomasa Rand, MD

## 2022-03-28 NOTE — Progress Notes (Unsigned)
HPI : Christian Owens is a very pleasant 61 year old [place] male who I initially met when he was hospitalized in February with nausea, vomiting, abdominal pain, weight loss and iron deficiency anemia.  An EGD performed at that time revealed a very large gastric ulcer with partial gastric outlet obstruction, initially very concerning for malignancy.  Biopsy of the ulcer revealed no evidence of malignancy.  H. pylori was present.  Patient experienced clinical improvement and was doing well at outpatient follow-up in early March.  He was treated with quadruple therapy, but it was unclear if he took it as directed.  He was scheduled for a repeat upper endoscopy to assess healing of the ulcer in late April, but he did not come for his scheduled appointment. His symptoms started to get worse a little over a month ago.  He is having episodes of abdominal pain, nausea and decreased appetite.  He vomited this morning, although this has not been a recurrent problem for him. He feels tired.  He denies presyncope.  His stools are not black or tarry.  He does have yellow stools frequently.  He has lost a few pounds in the last month.    Past Medical History:  Diagnosis Date   GERD (gastroesophageal reflux disease)   Gastric ulcer with partial obstruction H. Pylori gastritis   Past Surgical History:  Procedure Laterality Date   BIOPSY  11/25/2021   Procedure: BIOPSY;  Surgeon: Daryel November, MD;  Location: [facility] ENDOSCOPY;  Service: Gastroenterology;;   ESOPHAGOGASTRODUODENOSCOPY (EGD) WITH PROPOFOL N/A 11/25/2021   Procedure: ESOPHAGOGASTRODUODENOSCOPY (EGD) WITH PROPOFOL;  Surgeon: Daryel November, MD;  Location: [facility] ENDOSCOPY;  Service: Gastroenterology;  Laterality: N/A;   EGD, February 10:  Very large ulcer involving the incisura and antrum with partial gastric outlet obstruction.  The endoscope was able to traverse the stenosis with considerable difficulty.  Biopsies of the ulcer were positive  for H. pylori and gastric intestinal metaplasia, but negative for malignancy  No family history on file. Social History   Tobacco Use   Smoking status: Every Day    Packs/day: 0.50    Types: Cigarettes   Smokeless tobacco: Never  Substance Use Topics   Alcohol use: Yes    Comment: occasional   Drug use: Never   Current Outpatient Medications  Medication Sig Dispense Refill   acetaminophen (TYLENOL) 500 MG tablet Take 500 mg by mouth every 6 (six) hours as needed for headache.     No current facility-administered medications for this visit.   No Known Allergies   Review of Systems: All systems reviewed and negative except where noted in HPI.    No results found.  Physical Exam: BP 104/72   Pulse 78   Ht $R'5\' 1"'xh$  (1.549 m)   Wt 96 lb (43.5 kg)   BMI 18.14 kg/m  Constitutional: Pleasant, thin, [place] male in no acute distress.  Accompanied by son who provides translation and history HEENT: Normocephalic and atraumatic. Conjunctivae are normal. No scleral icterus. Cardiovascular: Normal rate, regular rhythm.  Pulmonary/chest: Effort normal and breath sounds normal. No wheezing, rales or rhonchi. Abdominal: Soft, nondistended, nontender. Bowel sounds active throughout. There are no masses palpable. No hepatomegaly.  Succussion splash not appreciated Extremities: no edema Neurological: Alert and oriented to person place and time. Skin: Skin is warm and dry. No rashes noted. Psychiatric: Normal mood and affect. Behavior is normal.  CBC    Component Value Date/Time   WBC 3.7 (L) 12/20/2021 1010   RBC  3.66 (L) 12/20/2021 1010   HGB 9.1 (L) 12/20/2021 1010   HCT 28.7 (L) 12/20/2021 1010   PLT 343.0 12/20/2021 1010   MCV 78.5 12/20/2021 1010   MCH 27.5 11/29/2021 0339   MCHC 31.7 12/20/2021 1010   RDW 20.4 (H) 12/20/2021 1010   LYMPHSABS 1.4 12/20/2021 1010   MONOABS 0.6 12/20/2021 1010   EOSABS 0.3 12/20/2021 1010   BASOSABS 0.0 12/20/2021 1010    CMP      Component Value Date/Time   NA 135 11/29/2021 0339   K 3.5 11/29/2021 0339   CL 102 11/29/2021 0339   CO2 25 11/29/2021 0339   GLUCOSE 205 (H) 11/29/2021 0339   BUN 6 (L) 11/29/2021 0339   CREATININE 0.89 11/29/2021 0339   CALCIUM 8.3 (L) 11/29/2021 0339   PROT 4.9 (L) 11/29/2021 0339   ALBUMIN 2.2 (L) 11/29/2021 0339   AST 41 11/29/2021 0339   ALT 52 (H) 11/29/2021 0339   ALKPHOS 55 11/29/2021 0339   BILITOT 0.2 (L) 11/29/2021 0339   GFRNONAA >60 11/29/2021 0339     ASSESSMENT AND PLAN: 61 year old male with admission in February for large, partially obstructive gastric ulcer with bleeding, H. pylori positive, and positive for intestinal metaplasia, but negative for dysplasia/malignancy, with initial clinical improvement, but with recent exacerbation of symptoms.  He no showed for his follow-up EGD to assess ulcer healing in April.  He has not been assessed for eradication of H. pylori.  I suspect that his ulcer is still present and he still has H. pylori infection. We will check H. pylori stool antigen, repeat CBC and CMP. We will start Protonix 40 mg p.o. twice daily following submission of the stool sample.  We will restart Carafate tablets, 1 g twice daily. We will schedule him for repeat EGD in late August.  Gastric ulcer, partially obstructive, H. pylori positive, previously treated with quadruple therapy - H. pylori stool antigen - CBC, CMP - Protonix 40 mg twice daily until repeat EGD - Carafate 1 g twice daily until repeat EGD - EGD in 10 weeks  The details, risks (including bleeding, perforation, infection, missed lesions, medication reactions and possible hospitalization or surgery if complications occur), benefits, and alternatives to EGD with possible biopsy and possible dilation were discussed with the patient and he consents to proceed.   Briza Bark E. Candis Schatz, MD [facility]  No ref. provider found

## 2022-03-30 NOTE — Progress Notes (Signed)
Christian Owens,  Your hemoglobin has dropped a little since March.  This is likely the reason you feel so tired.  You would benefit from taking oral iron supplements.  I recommend you take an iron pill twice a day to help your body replace the blood/iron stores you have lost. The rest of your labs looked good.  [name],  Please place a prescription for ferrous sulfate 325 mg PO BID #[id number]

## 2022-03-31 ENCOUNTER — Other Ambulatory Visit: Payer: Self-pay

## 2022-03-31 MED ORDER — FERROUS SULFATE 325 (65 FE) MG PO TBEC: 325.0000 mg | DELAYED_RELEASE_TABLET | Freq: Two times a day (BID) | ORAL | 1 refills | Status: AC

## 2022-04-03 ENCOUNTER — Other Ambulatory Visit: Payer: Commercial Managed Care - PPO

## 2022-04-03 DIAGNOSIS — K254 Chronic or unspecified gastric ulcer with hemorrhage: Secondary | ICD-10-CM

## 2022-04-05 ENCOUNTER — Other Ambulatory Visit: Payer: Self-pay

## 2022-04-05 ENCOUNTER — Other Ambulatory Visit (HOSPITAL_COMMUNITY): Payer: Self-pay

## 2022-04-05 LAB — H. PYLORI ANTIGEN, STOOL: H pylori Ag, Stl: POSITIVE — AB

## 2022-04-05 MED ORDER — TALICIA 250-12.5-10 MG PO CPDR
4.0000 | DELAYED_RELEASE_CAPSULE | Freq: Three times a day (TID) | ORAL | 0 refills | Status: AC
  Filled 2022-04-05: qty 168, fill #0

## 2022-04-05 NOTE — Progress Notes (Signed)
Christian Owens, Your stool antigen was positive for H. Pylori, indicating that it was not eradicated with the antibiotics you took previously (bismuth, metronidazole, doxycycline).   Will plan on treating with Talicia as below. Please confirm no medication allergies to the prescribed regimen.   Talicia (omeprazole/amoxicillin/rifabutin) 4 caps PO q8 hours x 14 days  Since talicia has omeprazole, he can stop the Protonix while taking Talicia, then resume Protonix following the 14 day course of Talicia, and continue to take it until the time of his repeat EGD (August 25th).  Dx: Gastric ulcer, H. Pylori gastritis   Christian Owens, Can you please contact Christian Owens (may need either interpretor, or talk to his son) and let him know the results and treatment plan?

## 2022-04-13 ENCOUNTER — Other Ambulatory Visit: Payer: Self-pay

## 2022-04-13 ENCOUNTER — Telehealth: Payer: Self-pay

## 2022-04-13 NOTE — Telephone Encounter (Signed)
Pt prescribed Phoebe Perch, will most likely need a PA.

## 2022-06-09 ENCOUNTER — Telehealth: Payer: Self-pay | Admitting: Gastroenterology

## 2022-06-09 ENCOUNTER — Encounter: Payer: Commercial Managed Care - PPO | Admitting: Gastroenterology

## 2022-06-09 NOTE — Telephone Encounter (Signed)
Good Morning Dr. Tomasa Rand,  I called this patient today at 7:15 to see if he was coming for his procedure today and did not get him on the phone however I did leave a message on his voicemail to call if he was running late or wanted to reschedule.  I will NO SHOW  uhc

## 2023-07-11 IMAGING — CT CT ABD-PELV W/O CM
2 of 4 series · 16 of 46 positions shown, 18 images · non-contrast
Comparison: None.

CLINICAL DATA: Generalized acute abdominal pain.



[Series 3: abd/ pelvis 5.0 i30f 2 · axial · 0.67mm/px · z∈[+727,+1117]mm · 13 of 86 slices shown, 15 images]
[im 4/86  soft-tissue]
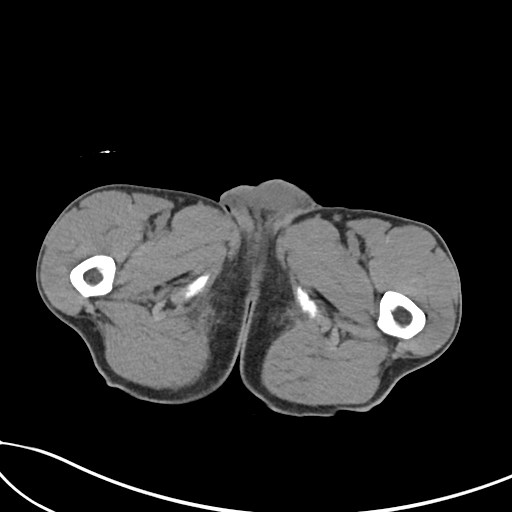
[im 4/86  bone]
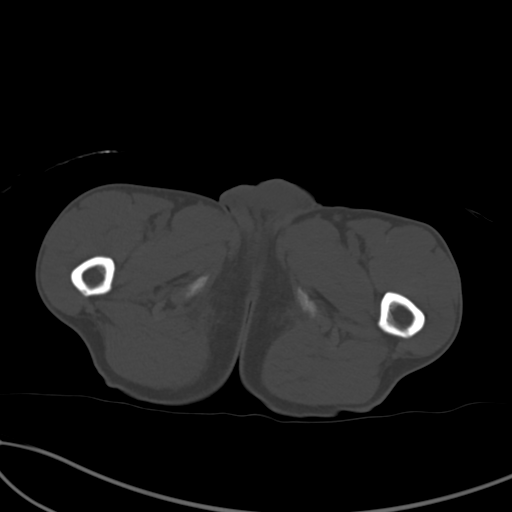
[im 11/86  soft-tissue]
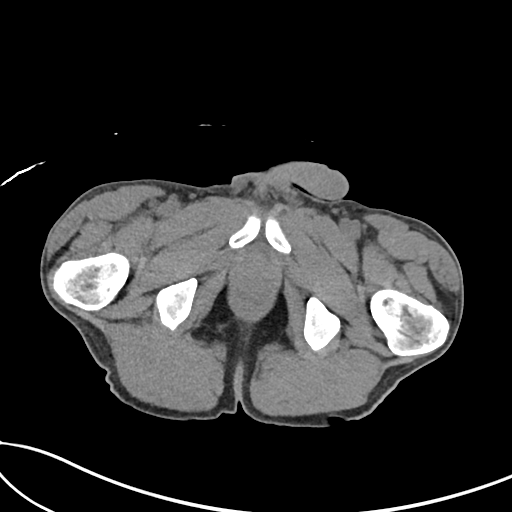
[im 18/86  soft-tissue]
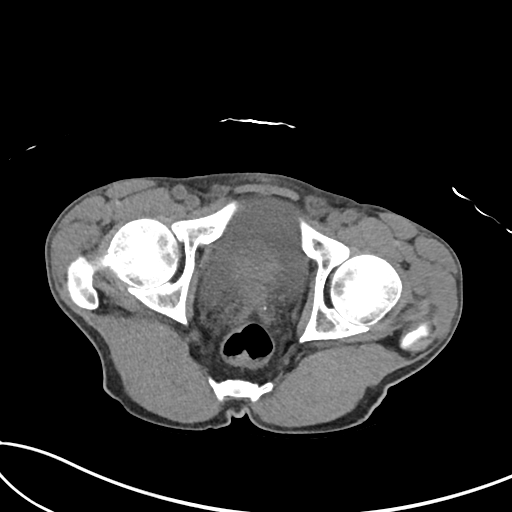
[im 25/86  soft-tissue]
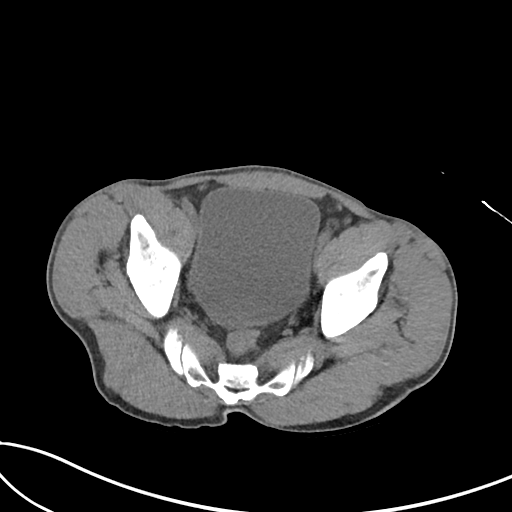
[im 29/86  soft-tissue]
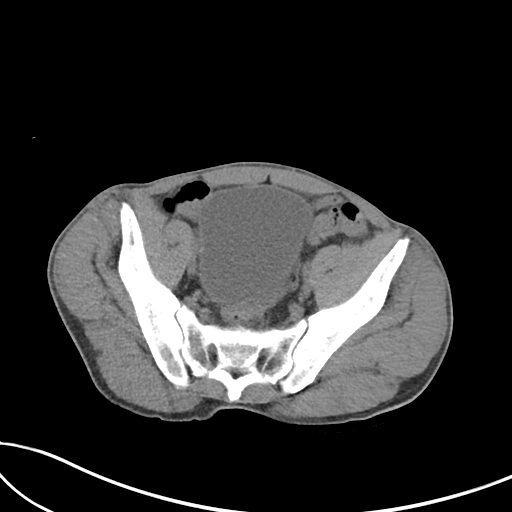
[im 36/86  soft-tissue]
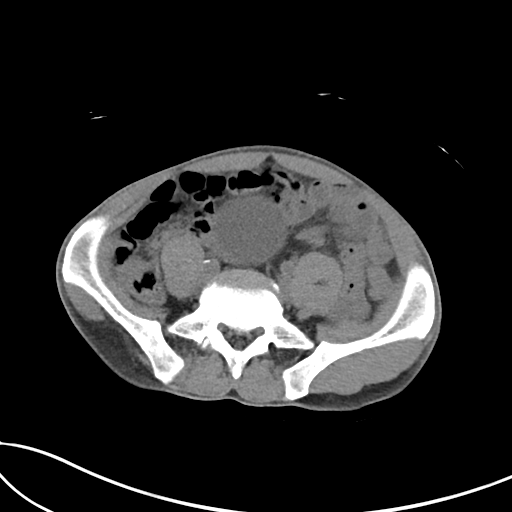
[im 43/86  soft-tissue]
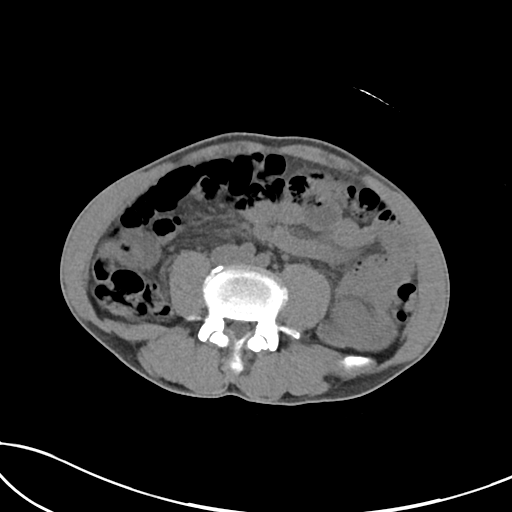
[im 50/86  soft-tissue]
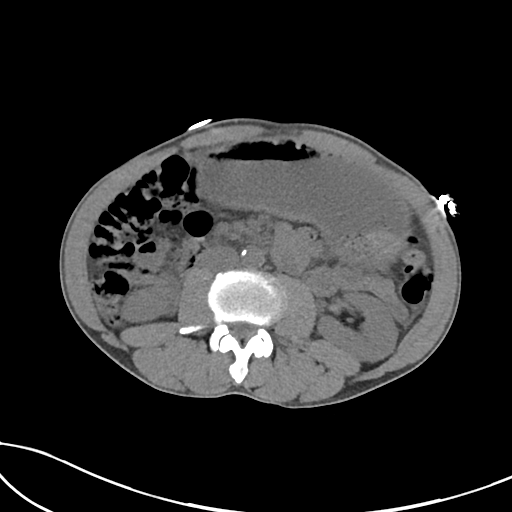
[im 57/86  soft-tissue]
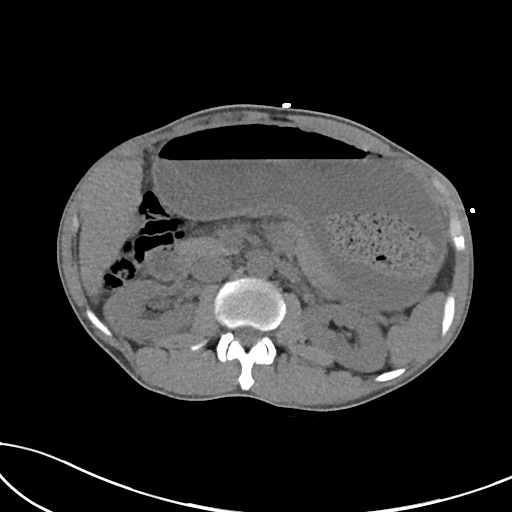
[im 57/86  bone]
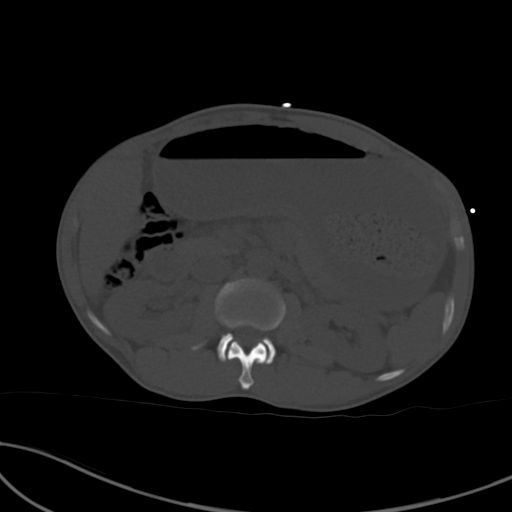
[im 61/86  soft-tissue]
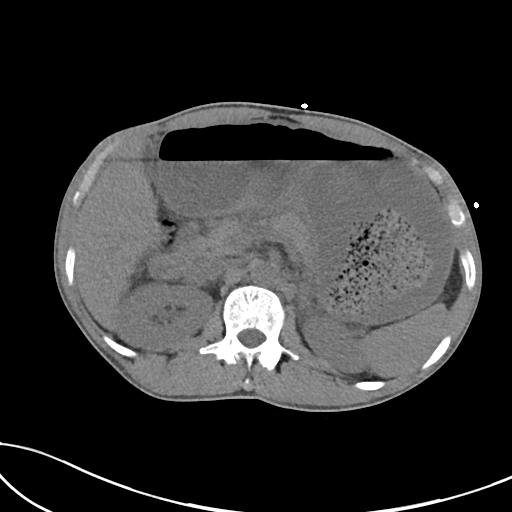
[im 68/86  soft-tissue]
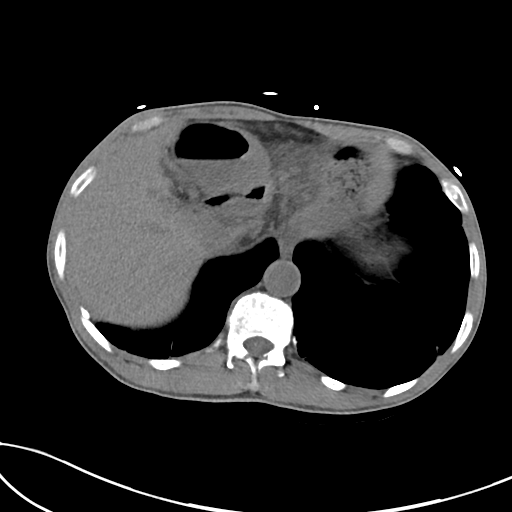
[im 75/86  soft-tissue]
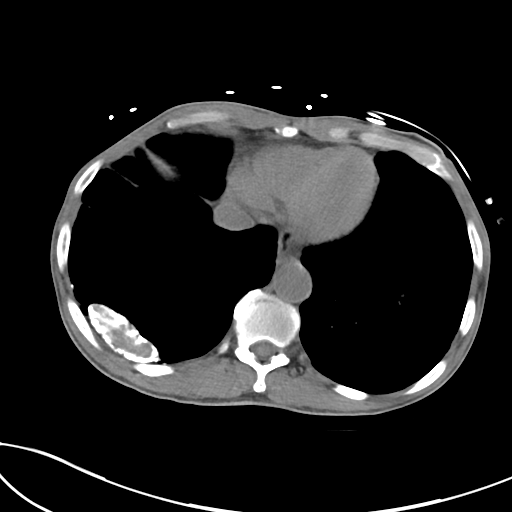
[im 82/86  soft-tissue]
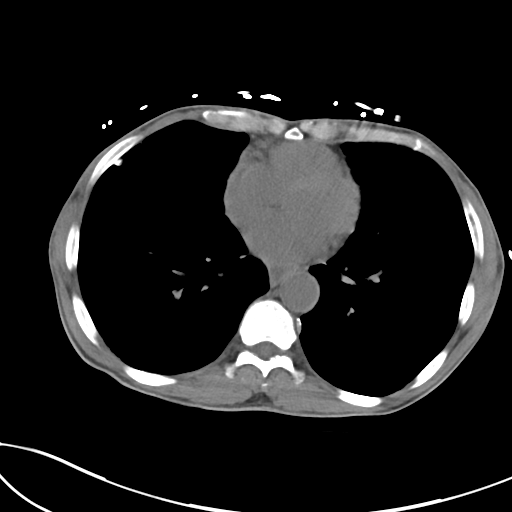

[Series 6: cor st · coronal · 0.67mm/px · 3 of 81 slices shown]
[im 27/81  soft-tissue]
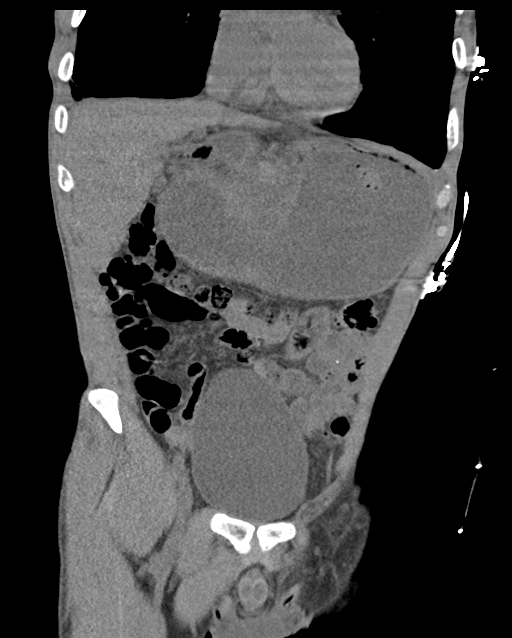
[im 36/81  soft-tissue]
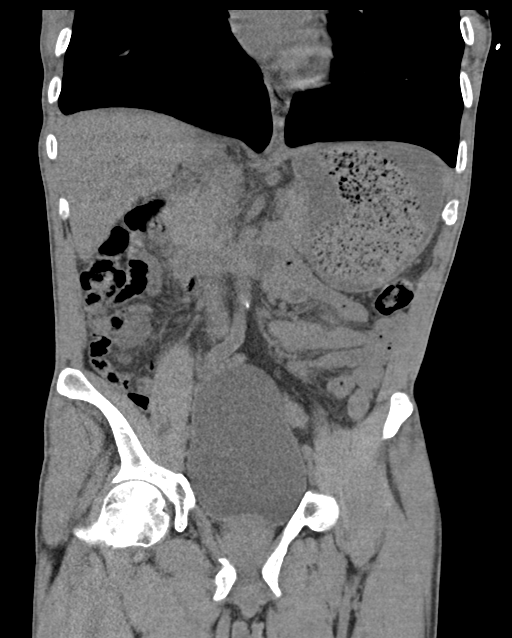
[im 45/81  soft-tissue]
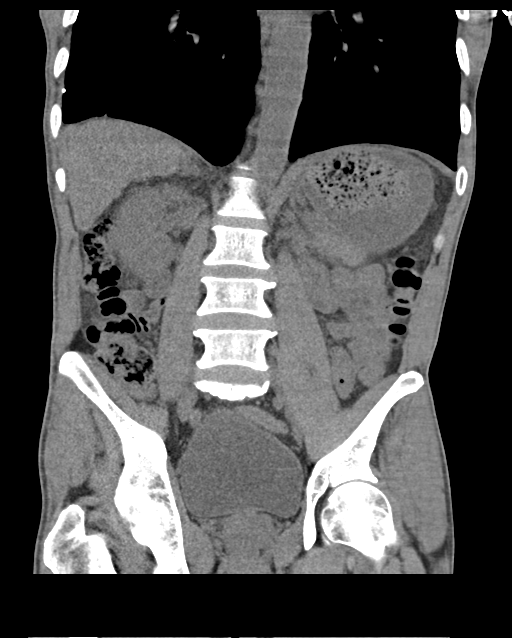

[16 of 46 positions shown; findings below may reference images not displayed]

FINDINGS: Lower chest: No acute findings. Chronic calcified pleural plaque
noted in the right lung base.

Hepatobiliary: No mass visualized on this unenhanced exam.
Gallbladder is unremarkable. No evidence of biliary ductal
dilatation.

Pancreas: No mass or inflammatory process visualized on this
unenhanced exam.

Spleen:  Within normal limits in size.

Adrenals/Urinary tract: No evidence of urolithiasis or
hydronephrosis. Distended urinary bladder noted, which could be due
to urinary retention.

Stomach/Bowel: Stomach is dilated, however duodenum and remainder of
bowel is normal in caliber. This could be due to gastric outlet
obstruction or gastroparesis. No evidence of obstruction,
inflammatory process, or abnormal fluid collections.

Vascular/Lymphatic: No pathologically enlarged lymph nodes
identified. No evidence of abdominal aortic aneurysm.

Reproductive:  Mildly enlarged prostate.

Other:  None.

Musculoskeletal:  No suspicious bone lesions identified.
IMPRESSION: Dilated stomach, which may be due to gastric outlet obstruction or
gastroparesis. No dilated bowel loops.

Mildly enlarged prostate.

Distended urinary bladder; urinary retention cannot be excluded.
Clinical correlation is recommended.

## 2023-07-11 IMAGING — DX DG CHEST 1V PORT
1 series · 1 of 1 positions shown · non-contrast
Comparison: No priors.

CLINICAL DATA: 60-year-old male with history of weakness and
abdominal pain for the past 7 days.

EXAM:
PORTABLE CHEST 1 VIEW

[chest]
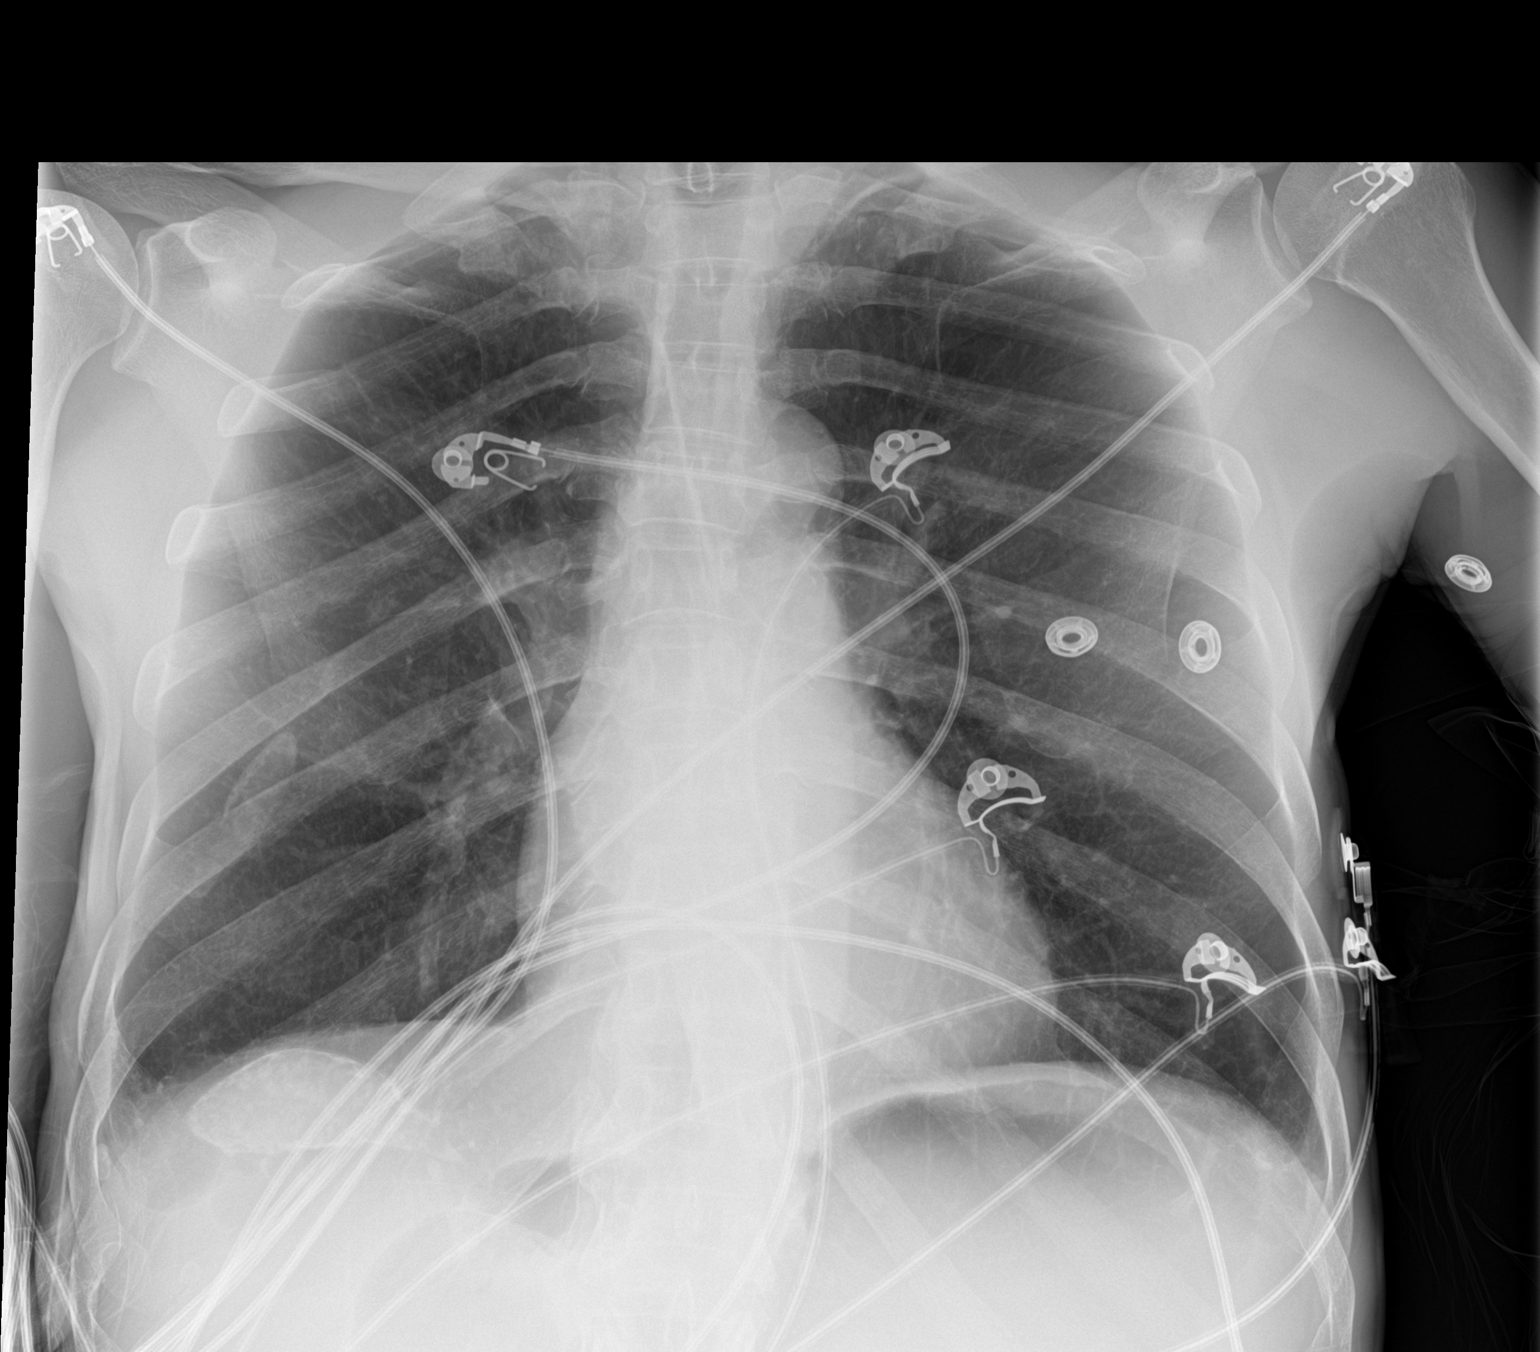

[1 of 1 positions shown; findings below may reference images not displayed]

FINDINGS: Lung volumes are normal. Calcified pleural plaques are noted in the
right hemithorax. No definite left-sided calcified pleural plaques
are noted. No consolidative airspace disease. No pleural effusions.
No pneumothorax. No pulmonary nodule or mass noted. Pulmonary
vasculature and the cardiomediastinal silhouette are within normal
limits.
IMPRESSION: 1. No radiographic evidence of acute cardiopulmonary disease.
2. Calcified right-sided pleural plaques, likely related to remote
right-sided empyema or prior right-sided thoracic trauma.

## 2023-07-16 IMAGING — CT CT CHEST-ABD-PELV W/ CM
2 of 5 series · 12 of 36 positions shown, 14 images · IV contrast (agent unspecified)
Comparison: Abdomen/pelvis CT 11/25/2021.

CLINICAL DATA: Gastric cancer.  Staging.

EXAM:
CT CHEST, ABDOMEN, AND PELVIS WITH CONTRAST
TECHNIQUE: Multidetector CT imaging of the chest, abdomen and pelvis was
performed following the standard protocol during bolus
administration of intravenous contrast.

[Series 3: cap 5.0 i31f 2 · axial · 0.77mm/px · z∈[+604,+1089]mm · 9 of 123 slices shown, 11 images]
[im 13/123  mediastinal]
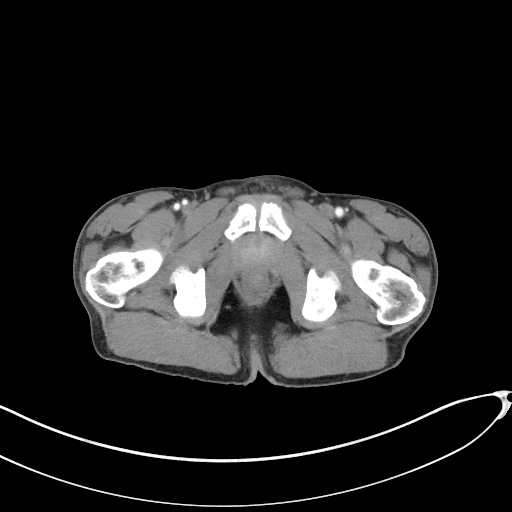
[im 13/123  bone]
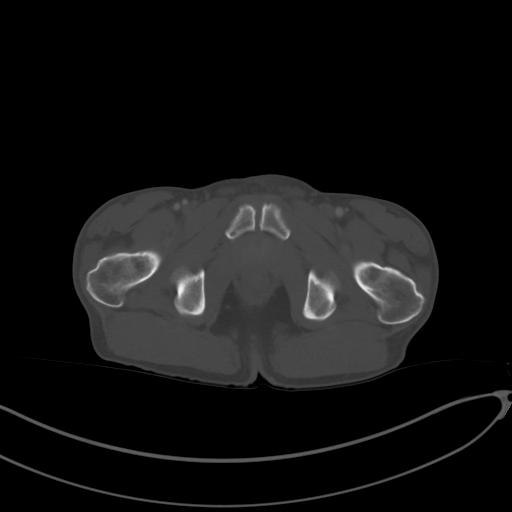
[im 25/123  mediastinal]
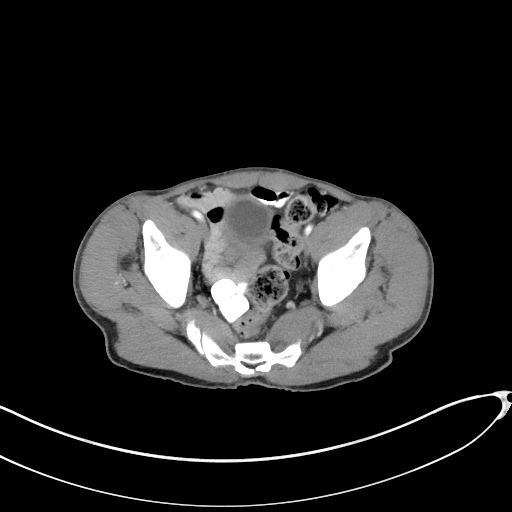
[im 37/123  mediastinal]
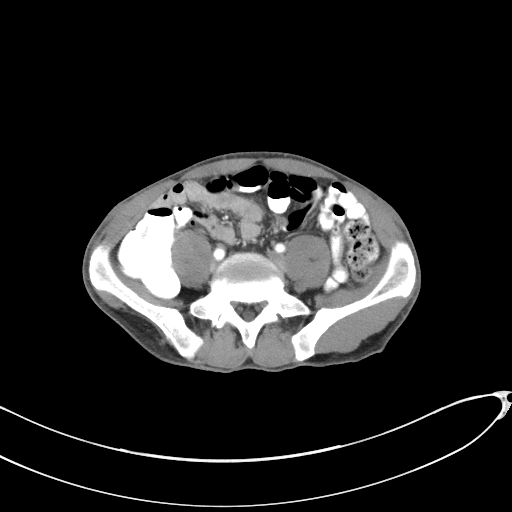
[im 49/123  mediastinal]
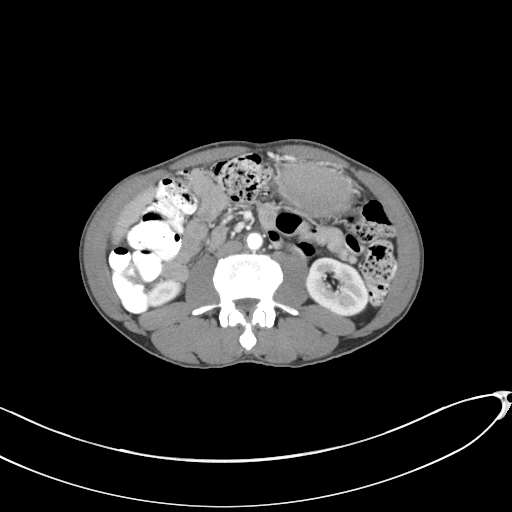
[im 62/123  mediastinal]
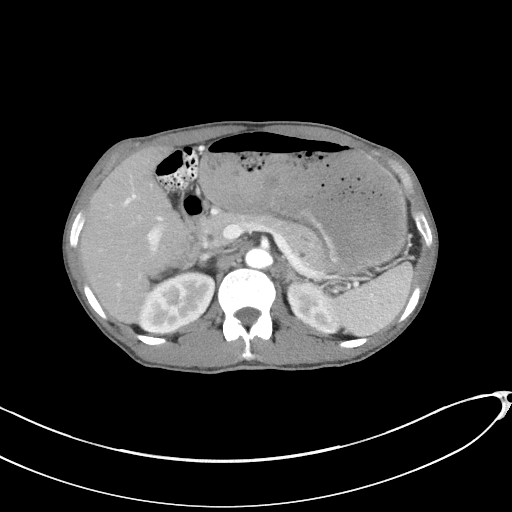
[im 74/123  mediastinal]
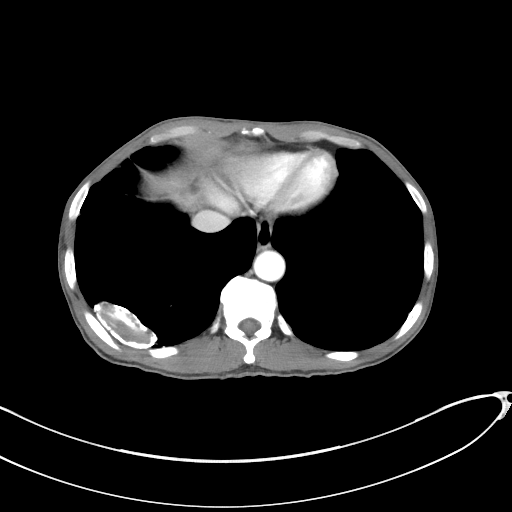
[im 86/123  mediastinal]
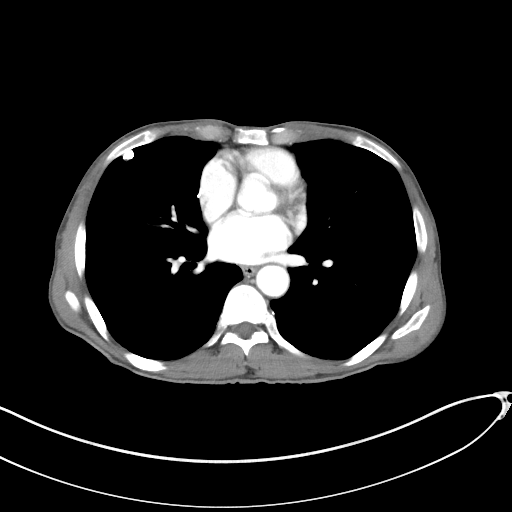
[im 98/123  mediastinal]
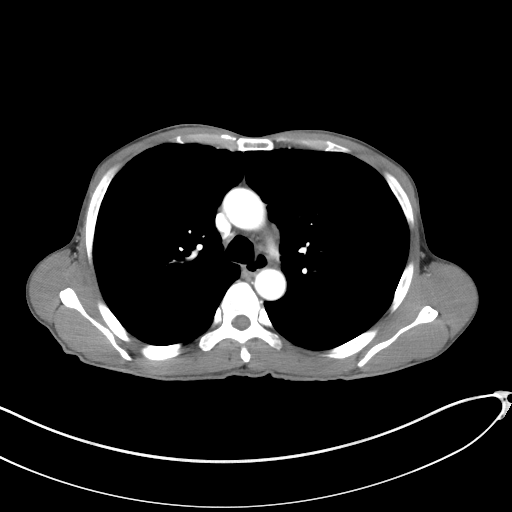
[im 110/123  mediastinal]
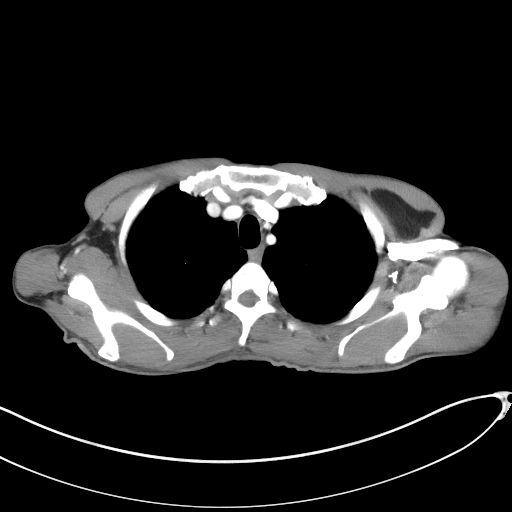
[im 110/123  bone]
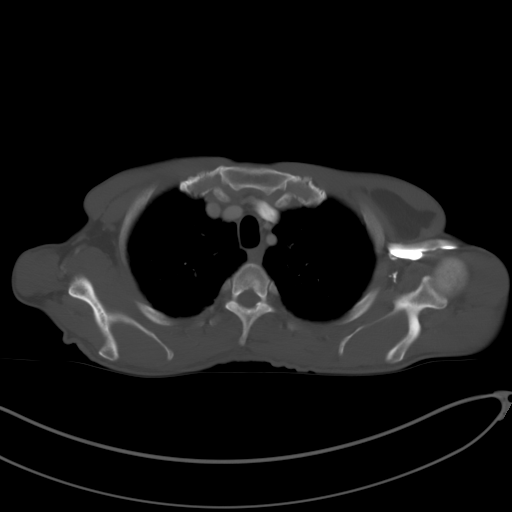

[Series 6: coronal · coronal · 0.72mm/px · 3 of 132 slices shown]
[im 27/132  mediastinal]
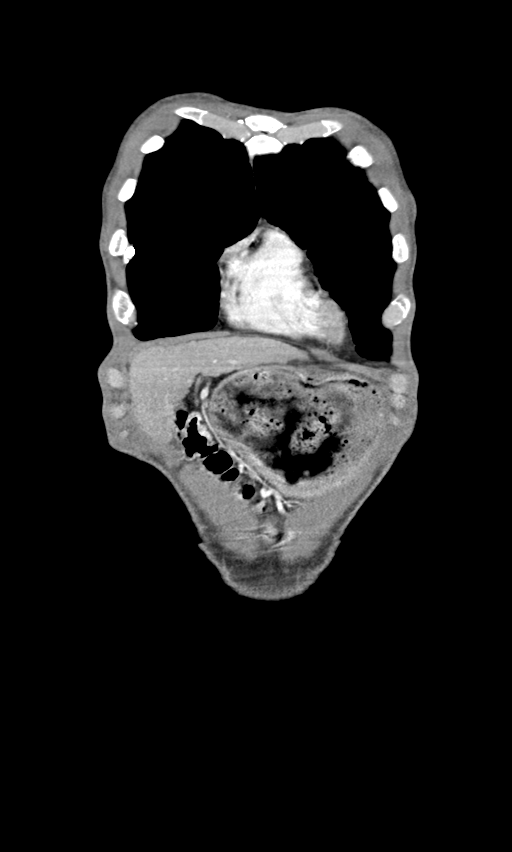
[im 53/132  mediastinal]
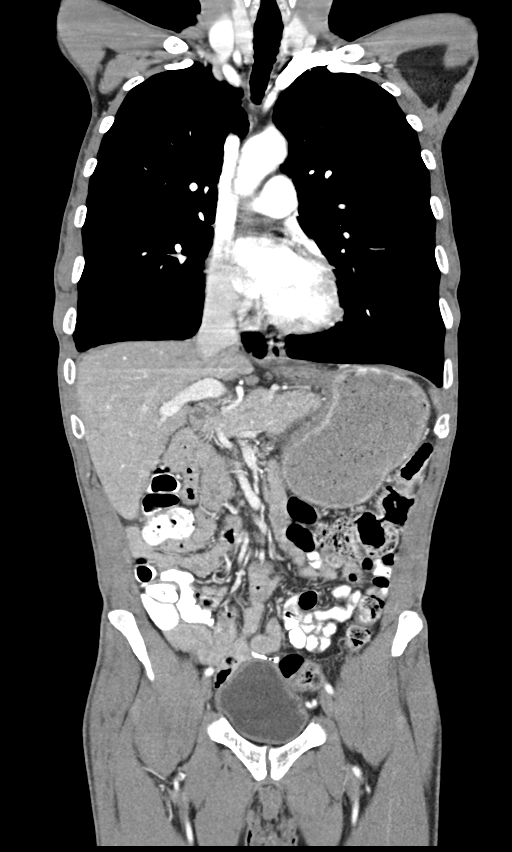
[im 79/132  mediastinal]
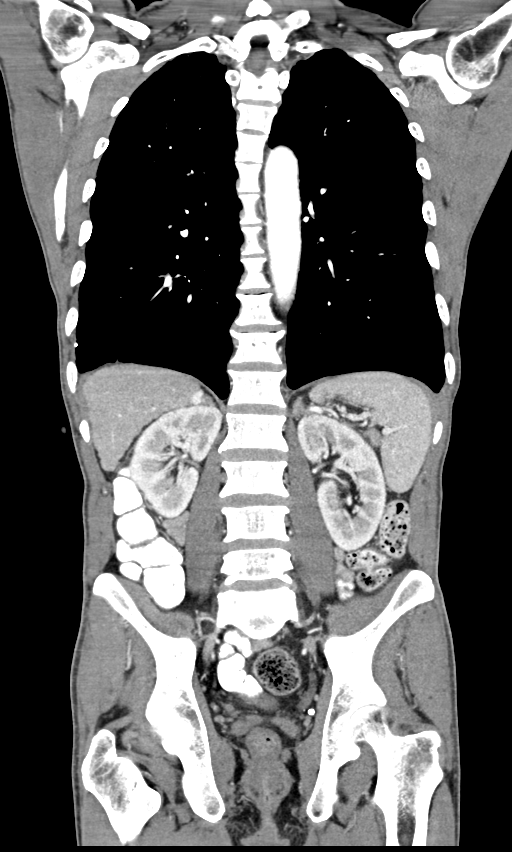

[12 of 36 positions shown; findings below may reference images not displayed]

RADIATION DOSE REDUCTION: This exam was performed according to the
departmental dose-optimization program which includes automated
exposure control, adjustment of the mA and/or kV according to
patient size and/or use of iterative reconstruction technique.

CONTRAST:  95mL OMNIPAQUE IOHEXOL 300 MG/ML  SOLN
FINDINGS: CT CHEST FINDINGS

Cardiovascular: The heart size is normal. No substantial pericardial
effusion. No thoracic aortic aneurysm.

Mediastinum/Nodes: No mediastinal lymphadenopathy. There is no hilar
lymphadenopathy. The esophagus has normal imaging features. There is
no axillary lymphadenopathy.

Lungs/Pleura: Centrilobular and paraseptal emphysema evident. 6.4 x
2.3 cm calcified pleural lesion identified posterior right lung
base. A second area of tubular calcified plaque-like pleural
thickening noted anterior right middle lobe. Subtle calcified
pleural plaque identified anterior left upper lobe on 75/5.
Additional subtle calcified pleural plaque identified paraspinal
left lower lobe on 99/11/21. No suspicious pulmonary parenchymal
nodule or mass. No focal airspace consolidation. No pleural
effusion.

Musculoskeletal: No worrisome lytic or sclerotic osseous
abnormality.

CT ABDOMEN PELVIS FINDINGS

Hepatobiliary: No suspicious focal abnormality within the liver
parenchyma. Nodular hepatic contour is more conspicuous on the
current study. Vascular malformation noted along the capsular
surface of the medial right liver on 59/3. Gallbladder is
decompressed. No intrahepatic or extrahepatic biliary dilation.

Pancreas: No focal mass lesion. No dilatation of the main duct. No
intraparenchymal cyst. No peripancreatic edema.

Spleen: No splenomegaly. No focal mass lesion.

Adrenals/Urinary Tract: No adrenal nodule or mass.

Stomach/Bowel: As before, stomach is markedly distended with food
and fluid. Duodenum is nondistended. No small bowel wall thickening.
No small bowel dilatation. The terminal ileum is normal. The
appendix is normal. No gross colonic mass. No colonic wall
thickening.

Vascular/Lymphatic: There is mild atherosclerotic calcification of
the abdominal aorta without aneurysm. Portal vein is patent. There
is no gastrohepatic or hepatoduodenal ligament lymphadenopathy. No
retroperitoneal or mesenteric lymphadenopathy. There is a single
small lymph node adjacent to the distal stomach (see image 63/3). No
pelvic sidewall lymphadenopathy.

Reproductive: Prostate gland is enlarged.

Other: No intraperitoneal free fluid.

Musculoskeletal: No worrisome lytic or sclerotic osseous
abnormality.
IMPRESSION: 1. No definite evidence for metastatic disease in the chest,
abdomen, or pelvis. Single small lymph node is identified adjacent
to the distal stomach but is not enlarged by CT size criteria.
2. Calcified pleural plaques, right greater than left. Findings
raise the question of previous asbestos exposure.
3. Nodular hepatic contour compatible with cirrhosis.
4. Marked distention of the stomach with food and fluid although
distention is decreased in the interval since prior study.
5. Prostatomegaly.
6. Aortic Atherosclerosis (VD7N0-01O.O) and Emphysema ([id number].L).
